# Patient Record
Sex: Female | Born: 1940 | Race: White | Hispanic: No | Smoking: Former smoker
Health system: Southern US, Community
[De-identification: ages and names within clinical notes are randomized; demographics above are authoritative.]

## PROBLEM LIST (undated history)

## (undated) DIAGNOSIS — C801 Malignant (primary) neoplasm, unspecified: Secondary | ICD-10-CM

## (undated) DIAGNOSIS — K219 Gastro-esophageal reflux disease without esophagitis: Secondary | ICD-10-CM

## (undated) DIAGNOSIS — Z972 Presence of dental prosthetic device (complete) (partial): Secondary | ICD-10-CM

## (undated) DIAGNOSIS — M419 Scoliosis, unspecified: Secondary | ICD-10-CM

## (undated) DIAGNOSIS — H269 Unspecified cataract: Secondary | ICD-10-CM

## (undated) DIAGNOSIS — I10 Essential (primary) hypertension: Secondary | ICD-10-CM

## (undated) DIAGNOSIS — T7840XA Allergy, unspecified, initial encounter: Secondary | ICD-10-CM

## (undated) DIAGNOSIS — I6529 Occlusion and stenosis of unspecified carotid artery: Secondary | ICD-10-CM

## (undated) DIAGNOSIS — M1990 Unspecified osteoarthritis, unspecified site: Secondary | ICD-10-CM

## (undated) DIAGNOSIS — E785 Hyperlipidemia, unspecified: Secondary | ICD-10-CM

## (undated) DIAGNOSIS — I341 Nonrheumatic mitral (valve) prolapse: Secondary | ICD-10-CM

## (undated) DIAGNOSIS — R011 Cardiac murmur, unspecified: Secondary | ICD-10-CM

## (undated) DIAGNOSIS — J449 Chronic obstructive pulmonary disease, unspecified: Secondary | ICD-10-CM

## (undated) HISTORY — PX: CARDIAC CATHETERIZATION: SHX172

## (undated) HISTORY — DX: Chronic obstructive pulmonary disease, unspecified: J44.9

## (undated) HISTORY — PX: CARPAL TUNNEL RELEASE: SHX101

## (undated) HISTORY — PX: ARTHROSCOPY KNEE W/ DRILLING: SUR92

## (undated) HISTORY — DX: Unspecified osteoarthritis, unspecified site: M19.90

## (undated) HISTORY — DX: Essential (primary) hypertension: I10

## (undated) HISTORY — DX: Unspecified cataract: H26.9

## (undated) HISTORY — PX: CATARACT EXTRACTION: SUR2

## (undated) HISTORY — PX: OTHER SURGICAL HISTORY: SHX169

## (undated) HISTORY — PX: BREAST SURGERY: SHX581

## (undated) HISTORY — DX: Allergy, unspecified, initial encounter: T78.40XA

## (undated) HISTORY — PX: CHOLECYSTECTOMY: SHX55

## (undated) HISTORY — PX: JOINT REPLACEMENT: SHX530

## (undated) HISTORY — DX: Hyperlipidemia, unspecified: E78.5

## (undated) HISTORY — PX: MOHS SURGERY: SUR867

## (undated) HISTORY — PX: EYE SURGERY: SHX253

## (undated) HISTORY — DX: Gastro-esophageal reflux disease without esophagitis: K21.9

## (undated) HISTORY — PX: ABDOMINAL HYSTERECTOMY: SHX81

## (undated) HISTORY — PX: HERNIA REPAIR: SHX51

## (undated) HISTORY — PX: BUNIONECTOMY: SHX129

## (undated) HISTORY — PX: REPLACEMENT TOTAL KNEE: SUR1224

## (undated) HISTORY — PX: APPENDECTOMY: SHX54

## (undated) HISTORY — PX: CARDIOVASCULAR STRESS TEST: SHX262

---

## 1980-12-19 HISTORY — PX: BREAST EXCISIONAL BIOPSY: SUR124

## 2007-12-20 HISTORY — PX: JOINT REPLACEMENT: SHX530

## 2013-12-19 HISTORY — PX: OTHER SURGICAL HISTORY: SHX169

## 2014-10-10 DIAGNOSIS — N393 Stress incontinence (female) (male): Secondary | ICD-10-CM | POA: Insufficient documentation

## 2014-10-10 DIAGNOSIS — F329 Major depressive disorder, single episode, unspecified: Secondary | ICD-10-CM | POA: Insufficient documentation

## 2014-10-10 DIAGNOSIS — J449 Chronic obstructive pulmonary disease, unspecified: Secondary | ICD-10-CM | POA: Insufficient documentation

## 2014-10-10 DIAGNOSIS — F32A Depression, unspecified: Secondary | ICD-10-CM | POA: Insufficient documentation

## 2014-10-15 ENCOUNTER — Ambulatory Visit: Payer: Self-pay | Admitting: Pain Medicine

## 2014-10-15 LAB — BASIC METABOLIC PANEL
ANION GAP: 4 — AB (ref 7–16)
BUN: 11 mg/dL (ref 7–18)
CHLORIDE: 109 mmol/L — AB (ref 98–107)
CREATININE: 0.8 mg/dL (ref 0.60–1.30)
Calcium, Total: 9.5 mg/dL (ref 8.5–10.1)
Co2: 30 mmol/L (ref 21–32)
EGFR (African American): >60
EGFR (Non-African Amer.): >60
Glucose: 104 mg/dL — ABNORMAL HIGH (ref 65–99)
Osmolality: 285 (ref 275–301)
POTASSIUM: 3.5 mmol/L (ref 3.5–5.1)
Sodium: 143 mmol/L (ref 136–145)

## 2014-10-15 LAB — HEPATIC FUNCTION PANEL A (ARMC)
ALT: 24 U/L
Albumin: 3.9 g/dL (ref 3.4–5.0)
Alkaline Phosphatase: 82 U/L
Bilirubin, Direct: 0.1 mg/dL (ref 0.0–0.2)
Bilirubin,Total: 0.6 mg/dL (ref 0.2–1.0)
SGOT(AST): 28 U/L (ref 15–37)
Total Protein: 7.9 g/dL (ref 6.4–8.2)

## 2014-10-15 LAB — SEDIMENTATION RATE: Erythrocyte Sed Rate: 16 mm/hr (ref 0–30)

## 2014-10-15 LAB — MAGNESIUM: Magnesium: 1.9 mg/dL

## 2014-10-23 ENCOUNTER — Ambulatory Visit: Payer: Self-pay | Admitting: Pain Medicine

## 2014-10-23 DIAGNOSIS — I2510 Atherosclerotic heart disease of native coronary artery without angina pectoris: Secondary | ICD-10-CM | POA: Insufficient documentation

## 2014-11-05 ENCOUNTER — Ambulatory Visit: Payer: Self-pay | Admitting: Pain Medicine

## 2014-11-10 DIAGNOSIS — I340 Nonrheumatic mitral (valve) insufficiency: Secondary | ICD-10-CM | POA: Insufficient documentation

## 2014-11-16 DIAGNOSIS — E782 Mixed hyperlipidemia: Secondary | ICD-10-CM | POA: Insufficient documentation

## 2014-12-30 ENCOUNTER — Ambulatory Visit: Payer: Self-pay | Admitting: Internal Medicine

## 2015-03-05 DIAGNOSIS — R002 Palpitations: Secondary | ICD-10-CM | POA: Insufficient documentation

## 2015-08-20 DIAGNOSIS — I10 Essential (primary) hypertension: Secondary | ICD-10-CM | POA: Insufficient documentation

## 2015-09-23 DIAGNOSIS — I351 Nonrheumatic aortic (valve) insufficiency: Secondary | ICD-10-CM | POA: Insufficient documentation

## 2015-10-12 ENCOUNTER — Ambulatory Visit: Payer: Medicare Other | Attending: Pain Medicine | Admitting: Pain Medicine

## 2015-10-12 ENCOUNTER — Encounter: Payer: Self-pay | Admitting: Pain Medicine

## 2015-10-12 VITALS — HR 63 | Temp 98.0°F | Resp 18 | Ht 62.0 in | Wt 152.0 lb

## 2015-10-12 DIAGNOSIS — F1120 Opioid dependence, uncomplicated: Secondary | ICD-10-CM

## 2015-10-12 DIAGNOSIS — G8929 Other chronic pain: Secondary | ICD-10-CM | POA: Insufficient documentation

## 2015-10-12 DIAGNOSIS — M810 Age-related osteoporosis without current pathological fracture: Secondary | ICD-10-CM | POA: Insufficient documentation

## 2015-10-12 DIAGNOSIS — M545 Low back pain, unspecified: Secondary | ICD-10-CM

## 2015-10-12 DIAGNOSIS — Z79891 Long term (current) use of opiate analgesic: Secondary | ICD-10-CM

## 2015-10-12 DIAGNOSIS — J449 Chronic obstructive pulmonary disease, unspecified: Secondary | ICD-10-CM | POA: Insufficient documentation

## 2015-10-12 DIAGNOSIS — M47896 Other spondylosis, lumbar region: Secondary | ICD-10-CM | POA: Insufficient documentation

## 2015-10-12 DIAGNOSIS — M47816 Spondylosis without myelopathy or radiculopathy, lumbar region: Secondary | ICD-10-CM

## 2015-10-12 DIAGNOSIS — M79605 Pain in left leg: Secondary | ICD-10-CM

## 2015-10-12 DIAGNOSIS — M4726 Other spondylosis with radiculopathy, lumbar region: Secondary | ICD-10-CM

## 2015-10-12 DIAGNOSIS — I10 Essential (primary) hypertension: Secondary | ICD-10-CM | POA: Diagnosis not present

## 2015-10-12 DIAGNOSIS — Z5181 Encounter for therapeutic drug level monitoring: Secondary | ICD-10-CM

## 2015-10-12 DIAGNOSIS — F1190 Opioid use, unspecified, uncomplicated: Secondary | ICD-10-CM | POA: Diagnosis not present

## 2015-10-12 DIAGNOSIS — Z79899 Other long term (current) drug therapy: Secondary | ICD-10-CM

## 2015-10-12 DIAGNOSIS — I2510 Atherosclerotic heart disease of native coronary artery without angina pectoris: Secondary | ICD-10-CM | POA: Diagnosis not present

## 2015-10-12 DIAGNOSIS — M549 Dorsalgia, unspecified: Secondary | ICD-10-CM | POA: Diagnosis present

## 2015-10-12 DIAGNOSIS — M5417 Radiculopathy, lumbosacral region: Secondary | ICD-10-CM

## 2015-10-12 MED ORDER — FENTANYL 25 MCG/HR TD PT72: 25.0000 ug | MEDICATED_PATCH | TRANSDERMAL | Status: DC

## 2015-10-12 MED ORDER — OXYCODONE-ACETAMINOPHEN 5-325 MG PO TABS: 1.0000 | ORAL_TABLET | Freq: Four times a day (QID) | ORAL | Status: DC | PRN

## 2015-10-12 NOTE — Progress Notes (Signed)
Patient's Name: Tina Frey MRN: [id number] DOB: 1941/06/25 DOS: 10/12/2015  Primary Reason(s) for Visit: Encounter for Medication Management. CC: Back Pain   HPI:   Tina Frey is a 74 y.o. year old, female patient, who returns today as an established patient. 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 Her primarily concern today is the Back Pain   The patient continues to have pain down the left lower extremity to the foot in what seems to be an S1 dermatomal distribution. In addition, she indicates that the worst of her pain is actually the lower back and going down to the buttocks. The patient was able to toe walk and heel walk today without any problems but she had significant discomfort on attempting to hyperextend backwards. I asked the patient about her last MRI and she cannot remember when she had it done. I asked her if her condition has continued to worsen and she indicated that it has. They also indicated that they have attempted some epidural steroid injections in the [place], [place]. Based on her condition in symptoms, believe that the patient probably has some left-sided foraminal stenosis, likely to be secondary to some facet hypertrophy. In addition, she does seem to have symptoms compatible with a lumbar facet syndrome on the left side. Today we will order an MRI  and have her come back after the test to abolish the results. Because her worst pain is the lower back, I have talked to her about the possibility of doing some diagnostic lumbar facet blocks, followed by some transforaminal epidural steroid injections.  Pharmacotherapy Review: Side-effects or Adverse reactions: None reported. Effectiveness: Described as relatively effective, allowing for increase in activities of daily living (ADL). Onset of action: Within expected pharmacological parameters. Duration of action: Within normal limits for medication. Peak effect: Timing and results are as within normal expected parameters. [place] PMP: Compliant with practice rules and regulations. DST: Compliant with practice rules and regulations. Lab work: No new labs ordered by our practice. Treatment compliance: Compliant. Substance Use Disorder (SUD) Risk Level: Low Planned course of action: Continue therapy as is.  Allergies: Tina Frey is allergic to cephalexin; ciprofloxacin; morphine and related; and sulfa antibiotics.  Meds: The patient has a current medication list which includes the following prescription(s): acetaminophen, albuterol, alendronate, aspirin, atorvastatin, biotin, fentanyl, fluticasone, lisinopril, loratadine, metoprolol succinate, oxycodone-acetaminophen, pantoprazole, sertraline, tiotropium, fentanyl, fentanyl, oxycodone-acetaminophen, and oxycodone-acetaminophen. Requested Prescriptions   Signed Prescriptions Disp Refills  . fentaNYL (DURAGESIC - DOSED MCG/HR) 25 MCG/HR patch 10 patch 0    Sig: Place 1 patch (25 mcg total) onto the skin every 3 (three) days.  Marland Kitchen oxyCODONE-acetaminophen (PERCOCET/ROXICET) 5-325 MG tablet 120 tablet 0    Sig: Take 1 tablet by mouth every 6 (six) hours as needed for severe pain.  . fentaNYL (DURAGESIC - DOSED MCG/HR) 25 MCG/HR patch 10 patch 0    Sig: Place 1 patch (25 mcg total) onto the skin every 3 (three) days.  Marland Kitchen oxyCODONE-acetaminophen  (PERCOCET) 5-325 MG tablet 120 tablet  0    Sig: Take 1 tablet by mouth every 6 (six) hours as needed for severe pain.  Marland Kitchen oxyCODONE-acetaminophen (PERCOCET) 5-325 MG tablet 120 tablet 0    Sig: Take 1 tablet by mouth every 6 (six) hours as needed for severe pain.  . fentaNYL (DURAGESIC - DOSED MCG/HR) 25 MCG/HR patch 10 patch 0    Sig: Place 1 patch (25 mcg total) onto the skin every 3 (three) days.    ROS: Constitutional: Afebrile, no chills, well hydrated and well nourished Gastrointestinal: negative Musculoskeletal:negative Neurological: negative Behavioral/Psych: negative  PFSH: Medical:  Tina Frey  has no past medical history on file. Family: family history includes Cancer in her mother; Heart disease in her brother and father. Surgical:  has past surgical history that includes Abdominal hysterectomy; Appendectomy; Hernia repair; Joint replacement; Breast surgery; Cholecystectomy; Hernia repair; Bunionectomy; cmc joint repair; Carpal tunnel release; Cataract extraction; Cardiac catheterization; biopsy lip; Arthroscopy knee w/ drilling; Replacement total knee; Carpal tunnel release; and right shoulder surgery. Tobacco:  has no tobacco history on file. Alcohol:  has no alcohol history on file. Drug:  has no drug history on file.  Physical Exam: Vitals:  Today's Vitals   10/12/15 0908 10/12/15 0912  Pulse: 63   Temp: 98 F (36.7 C)   TempSrc: Oral   Resp: 18   Height: 5\' 2"  (1.575 m)   Weight: 152 lb (68.947 kg)   PainSc:  9   Calculated BMI: Body mass index is 27.79 kg/(m^2). General appearance: alert, cooperative, appears stated age, moderate distress and mildly obese Eyes: conjunctivae/corneas clear. PERRL, EOM's intact. Fundi benign. Lungs: No evidence respiratory distress, no audible rales or ronchi and no use of accessory muscles of respiration Neck: no adenopathy, no carotid bruit, no JVD, supple, symmetrical, trachea midline and thyroid not enlarged, symmetric, no  tenderness/mass/nodules Back: symmetric, no curvature. ROM normal. No CVA tenderness. Extremities: extremities normal, atraumatic, no cyanosis or edema Pulses: 2+ and symmetric Skin: Skin color, texture, turgor normal. No rashes or lesions Neurologic: Gait: Antalgic    Assessment: Encounter Diagnosis:  Primary Diagnosis: Chronic pain [G89.29]  Plan: [place] was seen today for back pain.  Diagnoses and all orders for this visit:  Chronic pain -     fentaNYL (DURAGESIC - DOSED MCG/HR) 25 MCG/HR patch; Place 1 patch (25 mcg total) onto the skin every 3 (three) days. -     oxyCODONE-acetaminophen (PERCOCET/ROXICET) 5-325 MG tablet; Take 1 tablet by mouth every 6 (six) hours as needed for severe pain. -     fentaNYL (DURAGESIC - DOSED MCG/HR) 25 MCG/HR patch; Place 1 patch (25 mcg total) onto the skin every 3 (three) days. -     oxyCODONE-acetaminophen (PERCOCET) 5-325 MG tablet; Take 1 tablet by mouth every 6 (six) hours as needed for severe pain. -     oxyCODONE-acetaminophen (PERCOCET) 5-325 MG tablet; Take 1 tablet by mouth every 6 (six) hours as needed for severe pain. -     fentaNYL (DURAGESIC - DOSED MCG/HR) 25 MCG/HR patch; Place 1 patch (25 mcg total) onto the skin every 3 (three) days.  Chronic low back pain  Lumbar spondylosis  Lower extremity pain, left Comments: Left S1 dermatomal pain (lateral aspect of the foot)  Radicular pain of lumbosacral region Comments: Left S1 distribution Orders: -     MR Lumbar Spine Wo Contrast; Future  Facet syndrome, lumbar Comments: (L>R)  Opiate use  Uncomplicated opioid dependence ([place])  Long term prescription opiate use -  Drugs of abuse screen w/o alc, rtn urine-sln; Future  Long term current use of opiate analgesic  Encounter for therapeutic drug level monitoring     There are no Patient Instructions on file for this visit. Medications discontinued today:  Medications Discontinued During This Encounter  Medication  Reason  . fentaNYL (DURAGESIC - DOSED MCG/HR) 25 MCG/HR patch Reorder  . oxyCODONE-acetaminophen (PERCOCET/ROXICET) 5-325 MG tablet Reorder   Medications administered today:  Tina Frey had no medications administered during this visit.  Primary Care Physician: Glendon Axe, MD Location: [facility] Facility Note by: Kathlen Brunswick. Dossie Arbour, M.D, DABA, DABAPM, DABPM, DABIPP, FIPP

## 2015-10-12 NOTE — Assessment & Plan Note (Signed)
In another lower extremity pain is not as bad as the back pain, it does seem to have a dermatomal distribution following the S1. The plan is to order an MRI of the lumbar spine to determine if the patient has a surgically correctable lesion. The patient indicates that in the past she has had some epidural steroid injections with no benefit. We do not have a lot of information about this since they were [place] out of state. She does indicate that her condition has been worsening since the last time that she had an MRI done.

## 2015-10-12 NOTE — Assessment & Plan Note (Signed)
Between the patient's low back pain and leg pain, she indicates the low back to be worse. This low back pain is described to be on the left side. The plan is to do a diagnostic left sided lumbar facet block and determine if she would be a good candidate for RFA.

## 2015-10-12 NOTE — Progress Notes (Signed)
Safety precautions to be maintained throughout the outpatient stay will include: orient to surroundings, keep bed in low position, maintain call bell within reach at all times, provide assistance with transfer out of bed and ambulation.  

## 2015-10-16 DIAGNOSIS — Z96659 Presence of unspecified artificial knee joint: Secondary | ICD-10-CM | POA: Insufficient documentation

## 2015-10-29 ENCOUNTER — Ambulatory Visit
Admission: RE | Admit: 2015-10-29 | Discharge: 2015-10-29 | Disposition: A | Discharge status: Home / Self Care | Payer: Medicare Other | Source: Ambulatory Visit | Attending: Pain Medicine | Admitting: Pain Medicine

## 2015-10-29 DIAGNOSIS — M419 Scoliosis, unspecified: Secondary | ICD-10-CM | POA: Diagnosis not present

## 2015-10-29 DIAGNOSIS — M47896 Other spondylosis, lumbar region: Secondary | ICD-10-CM | POA: Insufficient documentation

## 2015-10-29 DIAGNOSIS — M5417 Radiculopathy, lumbosacral region: Secondary | ICD-10-CM

## 2015-10-29 DIAGNOSIS — E279 Disorder of adrenal gland, unspecified: Secondary | ICD-10-CM | POA: Insufficient documentation

## 2015-11-02 ENCOUNTER — Encounter: Payer: Self-pay | Admitting: Pain Medicine

## 2015-11-05 ENCOUNTER — Other Ambulatory Visit: Payer: Self-pay | Admitting: Pain Medicine

## 2015-11-30 ENCOUNTER — Encounter: Payer: Self-pay | Admitting: Pain Medicine

## 2015-11-30 ENCOUNTER — Ambulatory Visit: Payer: Medicare Other | Attending: Pain Medicine | Admitting: Pain Medicine

## 2015-11-30 ENCOUNTER — Telehealth: Payer: Self-pay

## 2015-11-30 VITALS — BP 155/61 | HR 62 | Temp 98.4°F | Resp 18 | Ht 62.0 in | Wt 148.0 lb

## 2015-11-30 DIAGNOSIS — M549 Dorsalgia, unspecified: Secondary | ICD-10-CM | POA: Diagnosis present

## 2015-11-30 DIAGNOSIS — I10 Essential (primary) hypertension: Secondary | ICD-10-CM | POA: Insufficient documentation

## 2015-11-30 DIAGNOSIS — E7801 Familial hypercholesterolemia: Secondary | ICD-10-CM | POA: Insufficient documentation

## 2015-11-30 DIAGNOSIS — M5124 Other intervertebral disc displacement, thoracic region: Secondary | ICD-10-CM

## 2015-11-30 DIAGNOSIS — G8929 Other chronic pain: Secondary | ICD-10-CM | POA: Diagnosis not present

## 2015-11-30 DIAGNOSIS — Z5181 Encounter for therapeutic drug level monitoring: Secondary | ICD-10-CM

## 2015-11-30 DIAGNOSIS — Z79891 Long term (current) use of opiate analgesic: Secondary | ICD-10-CM

## 2015-11-30 DIAGNOSIS — J449 Chronic obstructive pulmonary disease, unspecified: Secondary | ICD-10-CM | POA: Diagnosis not present

## 2015-11-30 DIAGNOSIS — M5126 Other intervertebral disc displacement, lumbar region: Secondary | ICD-10-CM | POA: Diagnosis not present

## 2015-11-30 DIAGNOSIS — M4726 Other spondylosis with radiculopathy, lumbar region: Secondary | ICD-10-CM

## 2015-11-30 DIAGNOSIS — Z79899 Other long term (current) drug therapy: Secondary | ICD-10-CM

## 2015-11-30 DIAGNOSIS — F1190 Opioid use, unspecified, uncomplicated: Secondary | ICD-10-CM

## 2015-11-30 DIAGNOSIS — M51369 Other intervertebral disc degeneration, lumbar region without mention of lumbar back pain or lower extremity pain: Secondary | ICD-10-CM

## 2015-11-30 DIAGNOSIS — M47816 Spondylosis without myelopathy or radiculopathy, lumbar region: Secondary | ICD-10-CM

## 2015-11-30 DIAGNOSIS — M4180 Other forms of scoliosis, site unspecified: Secondary | ICD-10-CM

## 2015-11-30 DIAGNOSIS — M47896 Other spondylosis, lumbar region: Secondary | ICD-10-CM

## 2015-11-30 DIAGNOSIS — I2510 Atherosclerotic heart disease of native coronary artery without angina pectoris: Secondary | ICD-10-CM | POA: Diagnosis not present

## 2015-11-30 DIAGNOSIS — M79605 Pain in left leg: Secondary | ICD-10-CM

## 2015-11-30 DIAGNOSIS — E559 Vitamin D deficiency, unspecified: Secondary | ICD-10-CM

## 2015-11-30 DIAGNOSIS — M5417 Radiculopathy, lumbosacral region: Secondary | ICD-10-CM | POA: Diagnosis not present

## 2015-11-30 DIAGNOSIS — M545 Low back pain: Secondary | ICD-10-CM

## 2015-11-30 DIAGNOSIS — I340 Nonrheumatic mitral (valve) insufficiency: Secondary | ICD-10-CM | POA: Diagnosis not present

## 2015-11-30 DIAGNOSIS — M5136 Other intervertebral disc degeneration, lumbar region: Secondary | ICD-10-CM | POA: Insufficient documentation

## 2015-11-30 NOTE — Telephone Encounter (Signed)
Called and discussed with MRI results- pt will come 12/01/15 to get prescriptions for 2 months- will need to cancell appt in January and reschedule for March- will give pt copy of MRi when she arrived to pick up Rx

## 2015-11-30 NOTE — Progress Notes (Signed)
Patient ID: Tina Frey, female   DOB: 09/05/1941, 74 y.o.   MRN: [id number] The patient came into the clinic today for her regular appointment, but unfortunately had to leave early because her husband had another medical appointment on the same date. We will not be billing this patient for this visit. However, I have taken the time to review her chart and updated. I have reviewed the results of her MRI and I have called the patient back and of her her as a return appointment to go over the MRI, provided with a copy of that, and also take care of her prescriptions. She indicated that she would be coming in tomorrow at some time to take care of that.

## 2015-11-30 NOTE — Progress Notes (Signed)
Safety precautions to be maintained throughout the outpatient stay will include: orient to surroundings, keep bed in low position, maintain call bell within reach at all times, provide assistance with transfer out of bed and ambulation.  Pt is here today for eval of MRI that was taken on 10/1015 and on EPIC to review Pt left- husband has a dr appt at 1030 and can not wait  Any longer- states she has appt in Jan and will find out MRI results then

## 2015-12-01 MED ORDER — FENTANYL 25 MCG/HR TD PT72: 25.0000 ug | MEDICATED_PATCH | TRANSDERMAL | Status: DC

## 2015-12-01 MED ORDER — OXYCODONE-ACETAMINOPHEN 5-325 MG PO TABS: 1.0000 | ORAL_TABLET | Freq: Four times a day (QID) | ORAL | Status: DC | PRN

## 2015-12-01 NOTE — Progress Notes (Signed)
Patient's Name: Tina Frey MRN: [id number] DOB: 10-23-1941 DOS: 11/30/2015  Primary Reason(s) for Visit: Encounter for Medication Management CC: Back Pain   HPI:   Tina Frey is a 74 y.o. year old, female patient, who returns today as an established patient. She has Encounter for therapeutic drug level monitoring; Long term current use of opiate analgesic; Long term prescription opiate use; Uncomplicated opioid dependence ([place]); Opiate use; Lumbar facet syndrome (L>R); Lumbosacral radiculopathy (Left) (S1 Dermatome); Lower extremity pain (Left) (Location of Primary Pain) (S1 Dermatome); Lumbar spondylosis; Chronic low back pain; Chronic pain; 3-vessel CAD; Benign essential HTN; Chronic obstructive pulmonary disease ([place]); Clinical depression; Combined fat and carbohydrate induced hyperlipemia; AI (aortic incompetence); MI (mitral incompetence); OP (osteoporosis); Awareness of heartbeats; 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 Her primarily concern today is the Back Pain     Today the patient came in for a nurse visit.  Today's Pain Score: 9 , clinically she looks like a 2-3/10. Reported level of pain is incompatible with clinical obrservations. This may be secondary to a possible lack of understanding on how the pain scale works. Pain Type: Chronic pain Pain Location: Back Pain Orientation: Lower Pain Descriptors / Indicators: Aching, Constant, Radiating Pain Frequency: Constant  Date of Last Visit: 10/12/15 Service Provided on Last Visit: Med Refill (scheduled MRI)  Pharmacotherapy Review:   Side-effects or Adverse reactions: None reported Effectiveness:  Described as relatively effective, allowing for increase in activities of daily living (ADL) Onset of action: Within expected pharmacological parameters Duration of action: Within normal limits for medication Peak effect: Timing and results are as within normal expected parameters [place] PMP: Compliant with practice rules and regulations UDS Results:   none available at this time UDS Interpretation: No UDS available, at this time Medication Assessment Form: Reviewed. Patient indicates being compliant with therapy Treatment compliance: Compliant Substance Use Disorder (SUD) Risk Level: Low Pharmacologic Plan: Continue therapy as is  Lab Work: Inflammation Markers Lab Results  Component Value Date   ESRSEDRATE 16 10/15/2014    Renal Function Lab Results  Component Value Date   BUN 11 10/15/2014   CREATININE 0.80 10/15/2014   GFRAA >60 10/15/2014   GFRNONAA >60 10/15/2014    Hepatic Function Lab Results  Component Value Date   AST 28 10/15/2014   ALT 24 10/15/2014   ALBUMIN 3.9 10/15/2014    Electrolytes Lab Results  Component Value Date   NA 143 10/15/2014   K 3.5 10/15/2014   CL 109* 10/15/2014   CALCIUM 9.5 123XX123    Illicit Drugs No results found for: THCU, COCAINSCRNUR, PCPSCRNUR, MDMA, AMPHETMU, METHADONE, ETOH   Allergies: Tina Frey is allergic to cephalexin; ciprofloxacin; morphine and related; and sulfa antibiotics.  Meds: The patient has a current medication list which includes the following prescription(s): albuterol, alendronate, aspirin, atorvastatin, biotin, fentanyl, fentanyl, fentanyl, fluticasone, lisinopril, loratadine, metoprolol succinate, oxycodone-acetaminophen, oxycodone-acetaminophen, pantoprazole, sertraline, tiotropium, and oxycodone-acetaminophen. Requested Prescriptions   Signed Prescriptions Disp Refills  . fentaNYL (DURAGESIC - DOSED MCG/HR) 25 MCG/HR patch 10 patch 0    Sig: Place 1 patch (25 mcg total) onto the skin every 3  (three) days.  . fentaNYL (DURAGESIC - DOSED MCG/HR) 25 MCG/HR patch 10 patch 0    Sig: Place  1 patch (25 mcg total) onto the skin every 3 (three) days.  Marland Kitchen oxyCODONE-acetaminophen (PERCOCET/ROXICET) 5-325 MG tablet 120 tablet 0    Sig: Take 1 tablet by mouth every 6 (six) hours as needed for moderate pain or severe pain.  Marland Kitchen oxyCODONE-acetaminophen (PERCOCET) 5-325 MG tablet 120 tablet 0    Sig: Take 1 tablet by mouth every 6 (six) hours as needed for moderate pain or severe pain.    ROS: Constitutional: Afebrile, no chills, well hydrated and well nourished Gastrointestinal: negative Musculoskeletal:negative Neurological: negative Behavioral/Psych: negative  PFSH: Medical:  Tina Frey  has a past medical history of GERD (gastroesophageal reflux disease); Cataract; Allergy; Arthritis; COPD (chronic obstructive pulmonary disease) ([place]); Hypertension; and Hyperlipidemia. Family: family history includes Cancer in her mother; Heart disease in her brother and father. Surgical:  has past surgical history that includes Abdominal hysterectomy; Appendectomy; Hernia repair; Joint replacement; Breast surgery; Cholecystectomy; Hernia repair; Bunionectomy; cmc joint repair; Carpal tunnel release; Cataract extraction; Cardiac catheterization; biopsy lip; Arthroscopy knee w/ drilling; Replacement total knee; Carpal tunnel release; and right shoulder surgery. Tobacco:  reports that she has never smoked. She does not have any smokeless tobacco history on file. Alcohol:  reports that she does not drink alcohol. Drug:  reports that she does not use illicit drugs.  Physical Exam: Vitals:  Today's Vitals   11/30/15 0902 11/30/15 0904  BP: 155/61   Pulse: 62   Temp: 98.4 F (36.9 C)   TempSrc: Oral   Resp: 18   Height: 5\' 2"  (1.575 m)   Weight: 148 lb (67.132 kg)   SpO2: 98%   PainSc:  9   Calculated BMI: Body mass index is 27.06 kg/(m^2). General appearance: alert, cooperative, appears stated age  and no distress Eyes: PERLA Respiratory: No evidence respiratory distress, no audible rales or ronchi and no use of accessory muscles of respiration Neck: no adenopathy, no carotid bruit, no JVD, supple, symmetrical, trachea midline and thyroid not enlarged, symmetric, no tenderness/mass/nodules  Cervical Spine ROM: Adequate for flexion, extension, rotation, and lateral bending Palpation: No palpable trigger points  Upper Extremities ROM: Adequate bilaterally Strength: 5/5 for all flexors and extensors of the upper extremity, bilaterally Pulses: Palpable bilaterally Neurologic: No allodynia, No hyperesthesia, No hyperpathia and No sensory abnormalities  Lumbar Spine ROM: Adequate for flexion, extension, rotation, and lateral bending Palpation: No palpable trigger points Lumbar Hyperextension and rotation: Non-contributory Patrick's Maneuver: Non-contributory  Lower Extremities ROM: Adequate bilaterally Strength: 5/5 for all flexors and extensors of the lower extremity, bilaterally Pulses: Palpable bilaterally Neurologic: No allodynia, No hyperesthesia, No hyperpathia, No sensory abnormalities and No antalgic gait or posture  Assessment: Encounter Diagnosis:  Primary Diagnosis: Chronic pain [G89.29]  Plan: Interventional Therapies: None at this point  [place] was seen today for back pain.  Diagnoses and all orders for this visit:  Chronic pain -     COMPLETE METABOLIC PANEL WITH GFR; Future -     C-reactive protein; Future -     Magnesium; Future -     Sedimentation rate; Future -     fentaNYL (DURAGESIC - DOSED MCG/HR) 25 MCG/HR patch; Place 1 patch (25 mcg total) onto the skin every 3 (three) days. -     fentaNYL (DURAGESIC - DOSED MCG/HR) 25 MCG/HR patch; Place 1 patch (25 mcg total) onto the skin every 3 (three) days. -     oxyCODONE-acetaminophen (PERCOCET/ROXICET) 5-325 MG tablet; Take 1 tablet by mouth every 6 (six) hours as needed for moderate pain or severe  pain. -      oxyCODONE-acetaminophen (PERCOCET) 5-325 MG tablet; Take 1 tablet by mouth every 6 (six) hours as needed for moderate pain or severe pain.  Opiate use  Long term prescription opiate use  Long term current use of opiate analgesic -     Drugs of abuse screen w/o alc, rtn urine-sln  Encounter for therapeutic drug level monitoring  Vitamin D deficiency -     Vitamin D pnl(25-hydrxy+1,25-dihy)-bld; Future  Lumbosacral radiculopathy (Left) (S1 Dermatome)  Levoscoliosis  Lower extremity pain (Left) (Location of Primary Pain) (S1 Dermatome)  Lumbar facet syndrome (L>R)  Lumbar spondylosis  Lumbar facet hypertrophy (L2-3, L3-4, L4-L5, L5-S1)  Thoracic disc herniation (T12-L1 disc protrusion)  Lumbar disc bulging (L2-3, L3-4, and L4-5)  Lumbar disc herniation  (Right) (L1-2: Small right-sided disc protrusion with upgoing disc material)     There are no Patient Instructions on file for this visit. Medications discontinued today:  Medications Discontinued During This Encounter  Medication Reason  . acetaminophen (TYLENOL) 500 MG tablet Error  . oxyCODONE-acetaminophen (PERCOCET/ROXICET) 123XX123 MG tablet Duplicate  . fentaNYL (DURAGESIC - DOSED MCG/HR) 25 MCG/HR patch Reorder  . fentaNYL (DURAGESIC - DOSED MCG/HR) 25 MCG/HR patch Reorder  . oxyCODONE-acetaminophen (PERCOCET) 5-325 MG tablet Reorder   Medications administered today:  Ms. Otterman had no medications administered during this visit.  Primary Care Physician: Glendon Axe, MD Location: [facility] Out[facility] Facility Note by: Kathlen Brunswick. Dossie Arbour, M.D, DABA, DABAPM, DABPM, DABIPP, FIPP

## 2016-01-11 ENCOUNTER — Encounter: Payer: Medicare Other | Admitting: Pain Medicine

## 2016-02-24 ENCOUNTER — Ambulatory Visit: Payer: Medicare Other | Attending: Pain Medicine | Admitting: Pain Medicine

## 2016-02-24 ENCOUNTER — Encounter: Payer: Self-pay | Admitting: Pain Medicine

## 2016-02-24 ENCOUNTER — Other Ambulatory Visit
Admission: RE | Admit: 2016-02-24 | Discharge: 2016-02-24 | Disposition: A | Discharge status: Home / Self Care | Payer: Medicare Other | Source: Ambulatory Visit | Attending: Pain Medicine | Admitting: Pain Medicine

## 2016-02-24 VITALS — BP 162/76 | HR 63 | Temp 97.7°F | Resp 16 | Ht 62.0 in | Wt 150.0 lb

## 2016-02-24 DIAGNOSIS — M810 Age-related osteoporosis without current pathological fracture: Secondary | ICD-10-CM | POA: Insufficient documentation

## 2016-02-24 DIAGNOSIS — G8929 Other chronic pain: Secondary | ICD-10-CM | POA: Insufficient documentation

## 2016-02-24 DIAGNOSIS — M899 Disorder of bone, unspecified: Secondary | ICD-10-CM | POA: Diagnosis not present

## 2016-02-24 DIAGNOSIS — E559 Vitamin D deficiency, unspecified: Secondary | ICD-10-CM | POA: Insufficient documentation

## 2016-02-24 DIAGNOSIS — F329 Major depressive disorder, single episode, unspecified: Secondary | ICD-10-CM | POA: Diagnosis not present

## 2016-02-24 DIAGNOSIS — F1120 Opioid dependence, uncomplicated: Secondary | ICD-10-CM | POA: Diagnosis not present

## 2016-02-24 DIAGNOSIS — M549 Dorsalgia, unspecified: Secondary | ICD-10-CM | POA: Diagnosis present

## 2016-02-24 DIAGNOSIS — M5126 Other intervertebral disc displacement, lumbar region: Secondary | ICD-10-CM | POA: Insufficient documentation

## 2016-02-24 DIAGNOSIS — K219 Gastro-esophageal reflux disease without esophagitis: Secondary | ICD-10-CM | POA: Diagnosis not present

## 2016-02-24 DIAGNOSIS — N393 Stress incontinence (female) (male): Secondary | ICD-10-CM | POA: Insufficient documentation

## 2016-02-24 DIAGNOSIS — Z79891 Long term (current) use of opiate analgesic: Secondary | ICD-10-CM | POA: Diagnosis not present

## 2016-02-24 DIAGNOSIS — J449 Chronic obstructive pulmonary disease, unspecified: Secondary | ICD-10-CM | POA: Diagnosis not present

## 2016-02-24 DIAGNOSIS — E7801 Familial hypercholesterolemia: Secondary | ICD-10-CM | POA: Diagnosis not present

## 2016-02-24 DIAGNOSIS — Z5181 Encounter for therapeutic drug level monitoring: Secondary | ICD-10-CM

## 2016-02-24 DIAGNOSIS — I340 Nonrheumatic mitral (valve) insufficiency: Secondary | ICD-10-CM | POA: Diagnosis not present

## 2016-02-24 DIAGNOSIS — I10 Essential (primary) hypertension: Secondary | ICD-10-CM | POA: Insufficient documentation

## 2016-02-24 DIAGNOSIS — Z96659 Presence of unspecified artificial knee joint: Secondary | ICD-10-CM | POA: Insufficient documentation

## 2016-02-24 DIAGNOSIS — M79605 Pain in left leg: Secondary | ICD-10-CM | POA: Diagnosis not present

## 2016-02-24 DIAGNOSIS — M5124 Other intervertebral disc displacement, thoracic region: Secondary | ICD-10-CM | POA: Diagnosis not present

## 2016-02-24 DIAGNOSIS — M79606 Pain in leg, unspecified: Secondary | ICD-10-CM | POA: Diagnosis present

## 2016-02-24 DIAGNOSIS — M47896 Other spondylosis, lumbar region: Secondary | ICD-10-CM | POA: Diagnosis not present

## 2016-02-24 DIAGNOSIS — Z9049 Acquired absence of other specified parts of digestive tract: Secondary | ICD-10-CM | POA: Diagnosis not present

## 2016-02-24 LAB — COMPREHENSIVE METABOLIC PANEL
ALBUMIN: 4.3 g/dL (ref 3.5–5.0)
ALK PHOS: 65 U/L (ref 38–126)
ALT: 17 U/L (ref 14–54)
ANION GAP: 6 (ref 5–15)
AST: 23 U/L (ref 15–41)
BILIRUBIN TOTAL: 0.8 mg/dL (ref 0.3–1.2)
BUN: 17 mg/dL (ref 6–20)
CALCIUM: 9.6 mg/dL (ref 8.9–10.3)
CO2: 28 mmol/L (ref 22–32)
CREATININE: 0.63 mg/dL (ref 0.44–1.00)
Chloride: 105 mmol/L (ref 101–111)
GFR calc non Af Amer: >60 mL/min (ref >60)
GLUCOSE: 96 mg/dL (ref 65–99)
Potassium: 4.5 mmol/L (ref 3.5–5.1)
SODIUM: 139 mmol/L (ref 135–145)
TOTAL PROTEIN: 7.4 g/dL (ref 6.5–8.1)

## 2016-02-24 LAB — MAGNESIUM: MAGNESIUM: 2 mg/dL (ref 1.7–2.4)

## 2016-02-24 LAB — SEDIMENTATION RATE: SED RATE: 12 mm/h (ref 0–30)

## 2016-02-24 LAB — C-REACTIVE PROTEIN

## 2016-02-24 MED ORDER — FENTANYL 25 MCG/HR TD PT72: 25.0000 ug | MEDICATED_PATCH | TRANSDERMAL | Status: DC

## 2016-02-24 MED ORDER — OXYCODONE HCL 10 MG PO TABS: 5.0000 mg | ORAL_TABLET | Freq: Every day | ORAL | Status: DC | PRN

## 2016-02-24 MED ORDER — OXYCODONE-ACETAMINOPHEN 5-325 MG PO TABS: 1.0000 | ORAL_TABLET | Freq: Four times a day (QID) | ORAL | Status: DC | PRN

## 2016-02-24 NOTE — Progress Notes (Signed)
Patient's Name: Tina Frey MRN: [id number] DOB: [birth year] DOS: 02/24/2016  Primary Reason(s) for Visit: Encounter for Medication Management CC: Back Pain and Leg Pain   HPI  Tina Frey is a 75 y.o. year old, female patient, who returns today as an established patient. 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 Her primarily concern today is the Back Pain and Leg Pain   The patient returns to the clinic today for pharmacological management of her chronic pain.  Pain Assessment: Self-Reported Pain Score: 8 , clinically she looks like a 1-2/10. Reported level is inconsistent with clinical obrservations Pain Type: Chronic pain Pain Location: Back Pain Orientation: Lower Pain Descriptors / Indicators: Aching, Burning, Tingling, Throbbing, Numbness Pain Frequency: Constant  Date of Last Visit: 11/30/15 Service Provided on Last Visit: Evaluation, Med Refill  Controlled Substance Pharmacotherapy  Assessment  Analgesic: Duragesic 25 mcg/h every 72 hours plus oxycodone/APAP 5/325 one tablet by mouth every 6 hours (The patient indicates using only an average of 1 per day. Occasionally she will use up to 3 per day, but she uses an average of 30 per months.) MME/day: 90 mg/day Pharmacokinetics: Onset of action (Liberation/Absorption): Within expected pharmacological parameters Time to Peak effect (Distribution): Timing and results are as within normal expected parameters Duration of action (Metabolism/Excretion): Within normal limits for medication Pharmacodynamics: Analgesic Effect: More than 50% Activity Facilitation: Medication(s) allow patient to sit, stand, walk, and do the basic ADLs Perceived Effectiveness: Described as relatively effective, allowing for increase in activities of daily living (ADL) Side-effects or Adverse reactions: None reported Monitoring: [place] PMP: Compliant with practice rules and regulations UDS Results/interpretation: The patient's last UDS was done on 10/12/2015 and it came back within normal limits and with no unexpected results. Medication Assessment Form: Reviewed. Patient indicates being compliant with therapy Treatment compliance: Compliant Risk Assessment: Aberrant Behavior: None observed today Substance Use Disorder (SUD) Risk Level: Low Opioid Risk Tool (ORT) Score: Total Score: 0 Low Risk for SUD (Score <3) Depression Scale Score: PHQ-2: PHQ-2 Total Score: 0 No depression (0) PHQ-9: PHQ-9 Total Score: 0 No depression (0-4)  Pharmacologic Plan: No change in therapy, at this time   Laboratory Workup  Last ED UDS: No results found for: THCU, COCAINSCRNUR, PCPSCRNUR, MDMA, AMPHETMU, METHADONE, ETOH  Inflammation Markers Lab Results  Component Value Date   ESRSEDRATE 16 10/15/2014    Renal Function Lab Results  Component Value Date   BUN 17 02/24/2016   CREATININE 0.63 02/24/2016   GFRAA >60 02/24/2016   GFRNONAA >60 02/24/2016     Hepatic Function Lab Results  Component Value Date   AST 23 02/24/2016   ALT 17 02/24/2016   ALBUMIN 4.3 02/24/2016    Electrolytes Lab Results  Component Value Date  NA 139 02/24/2016   K 4.5 02/24/2016   CL 105 02/24/2016   CALCIUM 9.6 02/24/2016   MG 2.0 02/24/2016    Allergies  Tina Frey is allergic to cephalexin; ciprofloxacin; morphine and related; and sulfa antibiotics.  Meds  The patient has a current medication list which includes the following prescription(s): albuterol, alendronate, aspirin, atorvastatin, biotin, calcium-vitamin d, fentanyl, fentanyl, fentanyl, fluticasone, lisinopril, loratadine, metoprolol succinate, multiple vitamins-minerals, pantoprazole, sertraline, tiotropium, oxycodone hcl, oxycodone hcl, and oxycodone hcl.  Current Outpatient Prescriptions on File Prior to Visit  Medication Sig  . ALBUTEROL IN Inhale into the lungs.  Marland Kitchen alendronate (FOSAMAX) 70 MG tablet Take 70 mg by mouth once a week. Take with a full glass of water on an empty stomach.  Marland Kitchen aspirin 81 MG chewable tablet Chew 81 mg by mouth daily.  Marland Kitchen atorvastatin (LIPITOR) 20 MG tablet Take 20 mg by mouth daily.  . Biotin 5000 MCG CAPS Take 5,000 capsules by mouth daily.  . fluticasone (FLONASE) 50 MCG/ACT nasal spray Place 2 sprays into both nostrils daily.  Marland Kitchen lisinopril (PRINIVIL,ZESTRIL) 10 MG tablet Take 10 mg by mouth daily.  Marland Kitchen loratadine (CLARITIN) 10 MG tablet Take 10 mg by mouth daily.  . metoprolol succinate (TOPROL-XL) 25 MG 24 hr tablet Take 25 mg by mouth 2 (two) times daily.  . pantoprazole (PROTONIX) 40 MG tablet Take 40 mg by mouth daily.  . sertraline (ZOLOFT) 50 MG tablet Take 50 mg by mouth daily.  Marland Kitchen tiotropium (SPIRIVA) 18 MCG inhalation capsule Place 18 mcg into inhaler and inhale daily.   No current facility-administered medications on file prior to visit.    ROS  Constitutional: Afebrile, no chills, well hydrated and well nourished Gastrointestinal:  negative Musculoskeletal:negative Neurological: negative Behavioral/Psych: negative  PFSH  Medical:  Tina Frey  has a past medical history of GERD (gastroesophageal reflux disease); Cataract; Allergy; Arthritis; COPD (chronic obstructive pulmonary disease) ([place]); Hypertension; and Hyperlipidemia. Family: family history includes Cancer in her mother; Heart disease in her brother and father. Surgical:  has past surgical history that includes Abdominal hysterectomy; Appendectomy; Hernia repair; Joint replacement; Breast surgery; Cholecystectomy; Hernia repair; Bunionectomy; cmc joint repair; Carpal tunnel release; Cataract extraction; Cardiac catheterization; biopsy lip; Arthroscopy knee w/ drilling; Replacement total knee; Carpal tunnel release; and right shoulder surgery. Tobacco:  reports that she has never smoked. She does not have any smokeless tobacco history on file. Alcohol:  reports that she does not drink alcohol. Drug:  reports that she does not use illicit drugs.  Physical Exam  Vitals:  Today's Vitals   02/24/16 0944  BP: 162/76  Pulse: 63  Temp: 97.7 F (36.5 C)  TempSrc: Oral  Resp: 16  Height: 5\' 2"  (1.575 m)  Weight: 150 lb (68.04 kg)  SpO2: 98%  PainSc: 8   PainLoc: Back    Calculated BMI: Body mass index is 27.43 kg/(m^2). Overweight (25-29.9 kg/m2) - 20% higher incidence of chronic pain  General appearance: alert, cooperative, appears stated age and no distress Eyes: PERLA Respiratory: No evidence respiratory distress, no audible rales or ronchi and no use of accessory muscles of respiration  Cervical Spine Inspection: Normal anatomy Alignment: Symetrical ROM: Adequate  Upper Extremities Inspection: No gross anomalies detected ROM: Adequate Sensory: Normal Motor: Unremarkable  Thoracic Spine Inspection: No gross anomalies detected Alignment: Symetrical ROM: Adequate  Lumbar Spine Inspection: No gross anomalies detected Alignment:  Symetrical ROM: Decreased  Gait: Antalgic (limping)  Lower Extremities Inspection: No gross anomalies detected ROM: Adequate Sensory:  Normal Motor: Unremarkable  Assessment & Plan  Primary Diagnosis & Pertinent Problem List: The primary encounter diagnosis was Chronic pain. Diagnoses of Encounter for therapeutic drug level monitoring, Long term current use of opiate analgesic, Lower extremity pain (Left) (Location of Primary Pain) (S1 Dermatome), Osteoporosis, post-menopausal, Vitamin D deficiency, and Chronic lower extremity pain (Location of Primary Source of Pain) (S1 Dermatome) (Left) were also pertinent to this visit.  Visit Diagnosis: 1. Chronic pain   2. Encounter for therapeutic drug level monitoring   3. Long term current use of opiate analgesic   4. Lower extremity pain (Left) (Location of Primary Pain) (S1 Dermatome)   5. Osteoporosis, post-menopausal   6. Vitamin D deficiency   7. Chronic lower extremity pain (Location of Primary Source of Pain) (S1 Dermatome) (Left)     Problem-specific Plan(s): No problem-specific assessment & plan notes found for this encounter.   Plan of Care  Pharmacotherapy (Medications Ordered): Meds ordered this encounter  Medications  . fentaNYL (DURAGESIC - DOSED MCG/HR) 25 MCG/HR patch    Sig: Place 1 patch (25 mcg total) onto the skin every 3 (three) days.    Dispense:  10 patch    Refill:  0    Do not place this medication, or any other prescription from our practice, on "Automatic Refill". Patient may have prescription filled one day early if pharmacy is closed on scheduled refill date. Do not fill until: 03/07/16 To last until: 04/06/16  . fentaNYL (DURAGESIC - DOSED MCG/HR) 25 MCG/HR patch    Sig: Place 1 patch (25 mcg total) onto the skin every 3 (three) days.    Dispense:  10 patch    Refill:  0    Do not place this medication, or any other prescription from our practice, on "Automatic Refill". Patient may have prescription  filled one day early if pharmacy is closed on scheduled refill date. Do not fill until: 04/06/16 To last until: 05/06/16  . DISCONTD: fentaNYL (DURAGESIC - DOSED MCG/HR) 25 MCG/HR patch    Sig: Place 1 patch (25 mcg total) onto the skin every 3 (three) days.    Dispense:  10 patch    Refill:  0    Do not place this medication, or any other prescription from our practice, on "Automatic Refill". Patient may have prescription filled one day early if pharmacy is closed on scheduled refill date. Do not fill until: 05/06/16 To last until: 06/05/16  . DISCONTD: oxyCODONE-acetaminophen (PERCOCET) 5-325 MG tablet    Sig: Take 1 tablet by mouth every 6 (six) hours as needed for severe pain.    Dispense:  120 tablet    Refill:  0    Do not place this medication, or any other prescription from our practice, on "Automatic Refill". Patient may have prescription filled one day early if pharmacy is closed on scheduled refill date. Do not fill until: 03/07/16 To last until: 04/06/16  . DISCONTD: oxyCODONE-acetaminophen (PERCOCET) 5-325 MG tablet    Sig: Take 1 tablet by mouth every 6 (six) hours as needed for moderate pain or severe pain.    Dispense:  120 tablet    Refill:  0    Do not place this medication, or any other prescription from our practice, on "Automatic Refill". Patient may have prescription filled one day early if pharmacy is closed on scheduled refill date. Do not fill until: 04/06/16 To last until: 05/06/16  . DISCONTD: oxyCODONE-acetaminophen (PERCOCET/ROXICET) 5-325 MG tablet    Sig: Take 1 tablet  by mouth every 6 (six) hours as needed for moderate pain or severe pain.    Dispense:  120 tablet    Refill:  0    Do not place this medication, or any other prescription from our practice, on "Automatic Refill". Patient may have prescription filled one day early if pharmacy is closed on scheduled refill date. Do not fill until: 05/06/16 To last until: 06/05/16  . Oxycodone HCl 10 MG TABS     Sig: Take 0.5-1 tablets (5-10 mg total) by mouth daily as needed.    Dispense:  30 tablet    Refill:  0    Do not place this medication, or any other prescription from our practice, on "Automatic Refill". Patient may have prescription filled one day early if pharmacy is closed on scheduled refill date. Do not fill until: 03/07/16 To last until: 04/06/16  . Oxycodone HCl 10 MG TABS    Sig: Take 0.5-1 tablets (5-10 mg total) by mouth daily as needed.    Dispense:  30 tablet    Refill:  0    Do not place this medication, or any other prescription from our practice, on "Automatic Refill". Patient may have prescription filled one day early if pharmacy is closed on scheduled refill date. Do not fill until: 04/06/16 To last until: 05/06/16  . Oxycodone HCl 10 MG TABS    Sig: Take 0.5-1 tablets (5-10 mg total) by mouth daily as needed.    Dispense:  30 tablet    Refill:  0    Do not place this medication, or any other prescription from our practice, on "Automatic Refill". Patient may have prescription filled one day early if pharmacy is closed on scheduled refill date. Do not fill until: 05/06/16 To last until: 06/05/16  . fentaNYL (DURAGESIC - DOSED MCG/HR) 25 MCG/HR patch    Sig: Place 1 patch (25 mcg total) onto the skin every 3 (three) days.    Dispense:  10 patch    Refill:  0    Do not place this medication, or any other prescription from our practice, on "Automatic Refill". Patient may have prescription filled one day early if pharmacy is closed on scheduled refill date. Do not fill until: 05/06/16 To last until: 06/05/16    [facility] & Procedure Ordered: Orders Placed This Encounter  Procedures  . ToxASSURE Select 13 (MW), Urine    Volume: 30 ml(s). Minimum 3 ml of urine is needed. Document temperature of fresh sample. Indications: Long term (current) use of opiate analgesic (Z79.891)  . Comprehensive metabolic panel    Standing Status: Future     Number of Occurrences: 1      Standing Expiration Date: 03/25/2016    Order Specific Question:  Has the patient fasted?    Answer:  No  . C-reactive protein    Standing Status: Future     Number of Occurrences: 1     Standing Expiration Date: 03/25/2016  . Magnesium    Standing Status: Future     Number of Occurrences: 1     Standing Expiration Date: 03/25/2016  . Sedimentation rate    Standing Status: Future     Number of Occurrences: 1     Standing Expiration Date: 03/25/2016  . Vitamin B12    Indication: Bone Pain (M89.9)    Standing Status: Future     Number of Occurrences: 1     Standing Expiration Date: 03/25/2016  . Vitamin D pnl(25-hydrxy+1,25-dihy)-bld    Standing Status:  Future     Number of Occurrences: 1     Standing Expiration Date: 03/25/2016    Imaging Ordered: None  Interventional Therapies: Scheduled: None at this time. PRN Procedures: None at this time.    Referral(s) or Consult(s): None at this time.  Medications administered during this visit: Tina Frey had no medications administered during this visit.  Future Appointments Date Time Provider [place]  05/25/2016 9:00 AM Milinda Pointer, MD [facility] None    Primary Care Physician: Glendon Axe, MD Location: [facility] Out[facility] Pain [facility] Facility Note by: Kathlen Brunswick. Dossie Arbour, M.D, DABA, DABAPM, DABPM, DABIPP, FIPP  Pain Score Disclaimer: We use the NRS-11 scale. This is a self-reported, subjective measurement of pain severity with only modest accuracy. It is used primarily to identify changes within a particular patient. It must be understood that outpatient pain scales are significantly less accurate that those used for research, where they can be applied under ideal controlled circumstances with minimal exposure to variables. In reality, the score is likely to be a combination of pain intensity and pain affect, where pain affect describes the degree of emotional arousal or changes in action readiness caused by the sensory  experience of pain. Factors such as social and work situation, setting, emotional state, anxiety levels, expectation, and prior pain experience may influence pain perception and show large inter-individual differences that may also be affected by time variables.

## 2016-02-24 NOTE — Progress Notes (Deleted)
   Subjective:    Patient ID: Tina Frey, female    DOB: [birth year], 75 y.o.   MRN: [id number]  HPI    Review of Systems     Objective:   Physical Exam        Assessment & Plan:

## 2016-02-24 NOTE — Progress Notes (Signed)
Safety precautions to be maintained throughout the outpatient stay will include: orient to surroundings, keep bed in low position, maintain call bell within reach at all times, provide assistance with transfer out of bed and ambulation.  Oxycodone = 85 pills Fentanyl patches = 0

## 2016-02-24 NOTE — Patient Instructions (Signed)
Instructed to get labwork today.

## 2016-02-25 LAB — VITAMIN D PNL(25-HYDRXY+1,25-DIHY)-BLD
Vit D, 1,25-Dihydroxy: 91.7 pg/mL — ABNORMAL HIGH (ref 19.9–79.3)
Vit D, 25-Hydroxy: 24.9 ng/mL — ABNORMAL LOW (ref 30.0–100.0)

## 2016-02-25 LAB — VITAMIN B12: Vitamin B-12: 216 pg/mL (ref 180–914)

## 2016-03-01 LAB — TOXASSURE SELECT 13 (MW), URINE: PDF: 0

## 2016-03-03 ENCOUNTER — Other Ambulatory Visit: Payer: Self-pay | Admitting: Pain Medicine

## 2016-03-03 DIAGNOSIS — E559 Vitamin D deficiency, unspecified: Secondary | ICD-10-CM | POA: Insufficient documentation

## 2016-03-03 MED ORDER — VITAMIN D (ERGOCALCIFEROL) 1.25 MG (50000 UNIT) PO CAPS: ORAL_CAPSULE | ORAL | Status: DC

## 2016-03-03 MED ORDER — VITAMIN D3 50 MCG (2000 UT) PO CAPS: ORAL_CAPSULE | ORAL | Status: DC

## 2016-03-03 NOTE — Progress Notes (Signed)
Quick Note:  Lab results reviewed and found to be within normal limits. ______ 

## 2016-03-03 NOTE — Progress Notes (Signed)

## 2016-04-08 ENCOUNTER — Other Ambulatory Visit: Payer: Self-pay | Admitting: Internal Medicine

## 2016-04-08 DIAGNOSIS — Z1231 Encounter for screening mammogram for malignant neoplasm of breast: Secondary | ICD-10-CM

## 2016-04-08 DIAGNOSIS — Z1239 Encounter for other screening for malignant neoplasm of breast: Secondary | ICD-10-CM

## 2016-04-09 ENCOUNTER — Emergency Department
Admission: EM | Admit: 2016-04-09 | Discharge: 2016-04-09 | Disposition: A | Discharge status: Home / Self Care | Payer: Medicare Other | Attending: Emergency Medicine | Admitting: Emergency Medicine

## 2016-04-09 ENCOUNTER — Encounter: Payer: Self-pay | Admitting: Emergency Medicine

## 2016-04-09 ENCOUNTER — Emergency Department: Payer: Medicare Other

## 2016-04-09 DIAGNOSIS — M79672 Pain in left foot: Secondary | ICD-10-CM | POA: Diagnosis not present

## 2016-04-09 DIAGNOSIS — E119 Type 2 diabetes mellitus without complications: Secondary | ICD-10-CM | POA: Diagnosis not present

## 2016-04-09 DIAGNOSIS — J449 Chronic obstructive pulmonary disease, unspecified: Secondary | ICD-10-CM | POA: Insufficient documentation

## 2016-04-09 DIAGNOSIS — M1990 Unspecified osteoarthritis, unspecified site: Secondary | ICD-10-CM | POA: Diagnosis not present

## 2016-04-09 DIAGNOSIS — I10 Essential (primary) hypertension: Secondary | ICD-10-CM | POA: Diagnosis not present

## 2016-04-09 DIAGNOSIS — Z7982 Long term (current) use of aspirin: Secondary | ICD-10-CM | POA: Insufficient documentation

## 2016-04-09 MED ORDER — NAPROXEN 500 MG PO TABS: 500.0000 mg | ORAL_TABLET | Freq: Two times a day (BID) | ORAL | Status: DC

## 2016-04-09 NOTE — ED Provider Notes (Signed)
[facility] Emergency Department Provider Note  ____________________________________________  Time seen: Approximately 10:02 AM  I have reviewed the triage vital signs and the nursing notes.   HISTORY  Chief Complaint Foot Pain   HPI Tina Frey is a 75 y.o. female is here with complaint of left foot pain which is worse with walking. She states her foot is been hurting for approximately 2-3 weeks. Patient states it was no history of an injury. She denies any swelling or redness. She has had bunion surgery in the past on her foot but no other surgeries. She states she currently is waiting for a appointment with podiatry. She is concerned because it "feels like my bones are moving in my foot".Patient has continued to be ambulatory. She currently is taking that now patches and oxycodone for chronic pain but states this does not help her foot. Occasionally she has taken Aleve with some improvement. She has not taken it on a frequent basis. She denies any GI difficulties. Probably she rates her pain as an 8 out of 10.   Past Medical History  Diagnosis Date  . GERD (gastroesophageal reflux disease)   . Cataract   . Allergy   . Arthritis   . COPD (chronic obstructive pulmonary disease) ([place])   . Hypertension   . Hyperlipidemia     Patient Active Problem List   Diagnosis Date Noted  . Vitamin D insufficiency 03/03/2016  . Chronic lower extremity pain (Location of Primary Source of Pain) (S1 Dermatome) (Left) 02/24/2016  . Levoscoliosis 11/30/2015  . Lumbar facet hypertrophy (L2-3, L3-4, L4-L5, L5-S1) 11/30/2015  . Thoracic disc herniation (T12-L1 disc protrusion) 11/30/2015  . Lumbar disc bulging (L2-3, L3-4, and L4-5) 11/30/2015  . Lumbar disc herniation  (Right) (L1-2: Small right-sided disc protrusion with upgoing disc material) 11/30/2015  . H/O total knee replacement 10/16/2015  . Encounter for therapeutic drug level monitoring 10/12/2015  . Long term  current use of opiate analgesic 10/12/2015  . Long term prescription opiate use 10/12/2015  . Uncomplicated opioid dependence ([place]) 10/12/2015  . Opiate use (90 MME/Day) 10/12/2015  . Lumbar facet syndrome (L>R) 10/12/2015  . Lumbosacral radiculopathy (Left) (S1 Dermatome) 10/12/2015  . Lumbar spondylosis 10/12/2015  . Chronic low back pain 10/12/2015  . Chronic pain 10/12/2015  . AI (aortic incompetence) 09/23/2015  . Benign essential HTN 08/20/2015  . Awareness of heartbeats 03/05/2015  . Combined fat and carbohydrate induced hyperlipemia 11/16/2014  . MI (mitral incompetence) 11/10/2014  . 3-vessel CAD 10/23/2014  . Chronic obstructive pulmonary disease ([place]) 10/10/2014  . Clinical depression 10/10/2014  . 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 Abdominal hysterectomy    . Appendectomy    . Hernia repair    . Joint replacement      knee replacement  . Breast surgery    . Cholecystectomy    . Hernia repair    . Bunionectomy      right  . [facility] joint repair      left  . Carpal tunnel release      left  . Cataract extraction      left  . Cardiac catheterization    . Biopsy lip    . Arthroscopy knee w/ drilling    . Replacement total knee      left  . Carpal tunnel release    . Right shoulder surgery      Current Outpatient Rx  Name  Route  Sig  Dispense  Refill  . ALBUTEROL IN   Inhalation   Inhale into the lungs.         Marland Kitchen alendronate (FOSAMAX) 70 MG tablet   Oral   Take 70 mg by mouth once a week. Take with a full glass of water on an empty stomach.         Marland Kitchen aspirin 81 MG chewable tablet   Oral   Chew 81 mg by mouth daily.         Marland Kitchen atorvastatin (LIPITOR) 20 MG tablet   Oral   Take 20 mg by mouth daily.         . Biotin 5000 MCG CAPS   Oral   Take 5,000 capsules by mouth daily.         . calcium-vitamin D (OSCAL WITH D) 500-200 MG-UNIT tablet   Oral   Take 1 tablet by mouth  daily.         . Cholecalciferol (VITAMIN D3) 2000 units capsule      Take 1 capsule (2,000 Units total) by mouth daily.   30 capsule   PRN     Do not place this medication, or any other prescri ...   . fentaNYL (DURAGESIC - DOSED MCG/HR) 25 MCG/HR patch   Transdermal   Place 1 patch (25 mcg total) onto the skin every 3 (three) days.   10 patch   0     Do not place this medication, or any other prescri ...   . fentaNYL (DURAGESIC - DOSED MCG/HR) 25 MCG/HR patch   Transdermal   Place 1 patch (25 mcg total) onto the skin every 3 (three) days.   10 patch   0     Do not place this medication, or any other prescri ...   . fentaNYL (DURAGESIC - DOSED MCG/HR) 25 MCG/HR patch   Transdermal   Place 1 patch (25 mcg total) onto the skin every 3 (three) days.   10 patch   0     Do not place this medication, or any other prescri ...   . fluticasone (FLONASE) 50 MCG/ACT nasal spray   Each Nare   Place 2 sprays into both nostrils daily.         Marland Kitchen lisinopril (PRINIVIL,ZESTRIL) 10 MG tablet   Oral   Take 10 mg by mouth daily.         Marland Kitchen loratadine (CLARITIN) 10 MG tablet   Oral   Take 10 mg by mouth daily.         . metoprolol succinate (TOPROL-XL) 25 MG 24 hr tablet   Oral   Take 25 mg by mouth 2 (two) times daily.         . Multiple Vitamins-Minerals (PRESERVISION AREDS PO)   Oral   Take 2 capsules by mouth daily.         . naproxen (NAPROSYN) 500 MG tablet   Oral   Take 1 tablet (500 mg total) by mouth 2 (two) times daily with a meal.   30 tablet   0   . Oxycodone HCl 10 MG TABS   Oral   Take 0.5-1 tablets (5-10 mg total) by mouth daily as needed.   30 tablet   0     Do not place this medication, or any other prescri ...   . Oxycodone HCl 10 MG TABS   Oral   Take 0.5-1 tablets (5-10 mg total) by mouth daily as needed.  30 tablet   0     Do not place this medication, or any other prescri ...   . Oxycodone HCl 10 MG TABS   Oral   Take 0.5-1  tablets (5-10 mg total) by mouth daily as needed.   30 tablet   0     Do not place this medication, or any other prescri ...   . pantoprazole (PROTONIX) 40 MG tablet   Oral   Take 40 mg by mouth daily.         . sertraline (ZOLOFT) 50 MG tablet   Oral   Take 50 mg by mouth daily.         Marland Kitchen tiotropium (SPIRIVA) 18 MCG inhalation capsule   Inhalation   Place 18 mcg into inhaler and inhale daily.         . Vitamin D, Ergocalciferol, (DRISDOL) 50000 units CAPS capsule      Take 1 capsule (50,000 Units total) by mouth 2 (two) times a week. X 6 weeks.   12 capsule   0     Do not place this medication, or any other prescri ...     Allergies Cephalexin; Ciprofloxacin; Morphine and related; and Sulfa antibiotics  Family History  Problem Relation Age of Onset  . Cancer Mother   . Heart disease Father   . Heart disease Brother     Social History Social History  Substance Use Topics  . Smoking status: Never Smoker   . Smokeless tobacco: None  . Alcohol Use: No    Review of Systems Constitutional: No fever/chills Cardiovascular: Denies chest pain. Respiratory: Denies shortness of breath. Gastrointestinal:  No nausea, no vomiting. Musculoskeletal: Positive for left foot pain. Skin: Negative for rash. Neurological: Negative for headaches, focal weakness or numbness.  10-point ROS otherwise negative.  ____________________________________________   PHYSICAL EXAM:  VITAL SIGNS: ED Triage Vitals  Enc Vitals Group     BP 04/09/16 0938 102/82 mmHg     Pulse Rate 04/09/16 0938 68     Resp 04/09/16 0938 18     Temp 04/09/16 0938 99.1 F (37.3 C)     Temp Source 04/09/16 0938 Oral     SpO2 04/09/16 0938 95 %     Weight 04/09/16 0938 155 lb (70.308 kg)     Height 04/09/16 0938 5\' 2"  (1.575 m)     Head Cir --      Peak Flow --      Pain Score 04/09/16 0938 8     Pain Loc --      Pain Edu? --      Excl. in [place]? --     Constitutional: Alert and oriented.  Well appearing and in no acute distress. Eyes: Conjunctivae are normal. PERRL. EOMI. Head: Atraumatic. Nose: No congestion/rhinnorhea. Neck: No stridor.   Cardiovascular: Normal rate, regular rhythm. Grossly normal heart sounds.  Good peripheral circulation. Respiratory: Normal respiratory effort.  No retractions. Lungs CTAB. Musculoskeletal: Left foot there is no gross deformity noted. There is moderate tenderness on palpation of the plantar aspect of the left foot and heel. There is associated marked tenderness on palpation of the arch area about edema. There is no edema, no erythema, no ecchymosis. Motor sensory function intact. Pulse within normal limits. Skin is warm and dry Neurologic:  Normal speech and language. No gross focal neurologic deficits are appreciated. No gait instability. Skin:  Skin is warm, dry and intact. As noted above. Psychiatric: Mood and affect are normal. Speech and  behavior are normal.  ____________________________________________   LABS (all labs ordered are listed, but only abnormal results are displayed)  Labs Reviewed - No data to display RADIOLOGY  Left foot per radiologist shows no fracture dislocation. ____________________________________________   PROCEDURES  Procedure(s) performed: None  Critical Care performed: No  ____________________________________________   INITIAL IMPRESSION / ASSESSMENT AND PLAN / ED COURSE  Pertinent labs & imaging results that were available during my care of the patient were reviewed by me and considered in my medical decision making (see chart for details).  Patient is to follow-up with Dr. Vickki Muff on May 2 for her appointment. In the meantime she was given a prescription for naproxen 500 mg twice a day with food. Patient is warned to discontinue medication if any stomach upset. She will continue to take her regular pain medication as directed. ____________________________________________   FINAL CLINICAL  IMPRESSION(S) / ED DIAGNOSES  Final diagnoses:  Acute pain of left foot      Johnn Hai, PA-C 04/09/16 1200  Delman Kitten, MD 04/09/16 1451

## 2016-04-09 NOTE — Discharge Instructions (Signed)
Continue regular medication as prescribed. Take naproxen 500 mg twice a day with food. Discontinue taking this medication if there is any abdominal pain or discomfort. Keep your appointment with the podiatrist. Ace wrap and wooden shoe. At night tried taking a bottle of water that is frozen and roll your foot over this while watching TV. Avoid walking or standing for long periods of time.

## 2016-04-09 NOTE — ED Notes (Signed)
Patient presents to the ED with left heel pain that is worse with walking.  Patient states, "I feel like my bones are moving around in my foot."  Patient reports difficulty getting an appt. With podiatry.  Patient denies history of blood clots.  Patient denies swelling or redness.  Patient denies trauma to foot.

## 2016-04-09 NOTE — ED Notes (Signed)
Waiting for printed discharge paperwork.

## 2016-04-21 ENCOUNTER — Ambulatory Visit: Payer: Medicare Other

## 2016-04-27 ENCOUNTER — Ambulatory Visit: Payer: Medicare Other | Admitting: Podiatry

## 2016-05-04 ENCOUNTER — Other Ambulatory Visit: Payer: Self-pay | Admitting: Internal Medicine

## 2016-05-04 ENCOUNTER — Ambulatory Visit
Admission: RE | Admit: 2016-05-04 | Discharge: 2016-05-04 | Disposition: A | Discharge status: Home / Self Care | Payer: Medicare Other | Source: Ambulatory Visit | Attending: Internal Medicine | Admitting: Internal Medicine

## 2016-05-04 DIAGNOSIS — Z1231 Encounter for screening mammogram for malignant neoplasm of breast: Secondary | ICD-10-CM

## 2016-05-25 ENCOUNTER — Encounter: Payer: Medicare Other | Admitting: Pain Medicine

## 2016-07-18 ENCOUNTER — Encounter: Payer: Self-pay | Admitting: *Deleted

## 2016-07-19 ENCOUNTER — Ambulatory Visit
Admission: RE | Admit: 2016-07-19 | Discharge: 2016-07-19 | Disposition: A | Discharge status: Home / Self Care | Payer: Medicare Other | Source: Ambulatory Visit | Attending: Gastroenterology | Admitting: Gastroenterology

## 2016-07-19 ENCOUNTER — Ambulatory Visit: Payer: Medicare Other | Admitting: Anesthesiology

## 2016-07-19 ENCOUNTER — Encounter
Admission: RE | Disposition: A | Discharge status: Home / Self Care | Payer: Self-pay | Source: Ambulatory Visit | Attending: Gastroenterology

## 2016-07-19 DIAGNOSIS — Z1211 Encounter for screening for malignant neoplasm of colon: Secondary | ICD-10-CM | POA: Diagnosis not present

## 2016-07-19 DIAGNOSIS — Z882 Allergy status to sulfonamides status: Secondary | ICD-10-CM | POA: Insufficient documentation

## 2016-07-19 DIAGNOSIS — J449 Chronic obstructive pulmonary disease, unspecified: Secondary | ICD-10-CM | POA: Insufficient documentation

## 2016-07-19 DIAGNOSIS — E785 Hyperlipidemia, unspecified: Secondary | ICD-10-CM | POA: Insufficient documentation

## 2016-07-19 DIAGNOSIS — D122 Benign neoplasm of ascending colon: Secondary | ICD-10-CM | POA: Insufficient documentation

## 2016-07-19 DIAGNOSIS — K219 Gastro-esophageal reflux disease without esophagitis: Secondary | ICD-10-CM | POA: Insufficient documentation

## 2016-07-19 DIAGNOSIS — Z79899 Other long term (current) drug therapy: Secondary | ICD-10-CM | POA: Diagnosis not present

## 2016-07-19 DIAGNOSIS — I2510 Atherosclerotic heart disease of native coronary artery without angina pectoris: Secondary | ICD-10-CM | POA: Insufficient documentation

## 2016-07-19 DIAGNOSIS — I10 Essential (primary) hypertension: Secondary | ICD-10-CM | POA: Insufficient documentation

## 2016-07-19 DIAGNOSIS — G709 Myoneural disorder, unspecified: Secondary | ICD-10-CM | POA: Insufficient documentation

## 2016-07-19 DIAGNOSIS — Z87891 Personal history of nicotine dependence: Secondary | ICD-10-CM | POA: Diagnosis not present

## 2016-07-19 DIAGNOSIS — F329 Major depressive disorder, single episode, unspecified: Secondary | ICD-10-CM | POA: Insufficient documentation

## 2016-07-19 DIAGNOSIS — D123 Benign neoplasm of transverse colon: Secondary | ICD-10-CM | POA: Diagnosis not present

## 2016-07-19 DIAGNOSIS — M1990 Unspecified osteoarthritis, unspecified site: Secondary | ICD-10-CM | POA: Insufficient documentation

## 2016-07-19 DIAGNOSIS — K5730 Diverticulosis of large intestine without perforation or abscess without bleeding: Secondary | ICD-10-CM | POA: Insufficient documentation

## 2016-07-19 DIAGNOSIS — Z7982 Long term (current) use of aspirin: Secondary | ICD-10-CM | POA: Insufficient documentation

## 2016-07-19 DIAGNOSIS — I341 Nonrheumatic mitral (valve) prolapse: Secondary | ICD-10-CM | POA: Insufficient documentation

## 2016-07-19 HISTORY — PX: COLONOSCOPY WITH PROPOFOL: SHX5780

## 2016-07-19 HISTORY — DX: Nonrheumatic mitral (valve) prolapse: I34.1

## 2016-07-19 SURGERY — COLONOSCOPY WITH PROPOFOL
Anesthesia: General

## 2016-07-19 MED ORDER — LIDOCAINE HCL (PF) 2 % IJ SOLN
INTRAMUSCULAR | Status: DC | PRN
  Administered 2016-07-19: 50 mg via INTRADERMAL

## 2016-07-19 MED ORDER — SODIUM CHLORIDE 0.9 % IV SOLN
INTRAVENOUS | Status: DC
  Administered 2016-07-19: 1000 mL via INTRAVENOUS

## 2016-07-19 MED ORDER — PROPOFOL 500 MG/50ML IV EMUL
INTRAVENOUS | Status: DC | PRN
  Administered 2016-07-19: 75 ug/kg/min via INTRAVENOUS

## 2016-07-19 MED ORDER — PROPOFOL 10 MG/ML IV BOLUS
INTRAVENOUS | Status: DC | PRN
  Administered 2016-07-19 (×2): 30 mg via INTRAVENOUS
  Administered 2016-07-19: 40 mg via INTRAVENOUS
  Administered 2016-07-19: 30 mg via INTRAVENOUS

## 2016-07-19 MED ORDER — SODIUM CHLORIDE 0.9 % IV SOLN: INTRAVENOUS | Status: DC

## 2016-07-19 NOTE — H&P (Signed)
Outpatient short stay form Pre-procedure 07/19/2016 11:13 AM Tina Sails MD  Primary Physician: Dr. Glendon Axe  Reason for visit:  Colonoscopy  History of present illness:  Patient is a 75 year old female presenting today as above. Her last colonoscopy was about 10 years ago. There are no findings on that. She does take 81 mg aspirin is held that for a few days. She tolerated her prep well.    Current Facility-Administered Medications:  .  0.9 %  sodium chloride infusion, , Intravenous, Continuous, Tina Sails, MD, Last Rate: 20 mL/hr at 07/19/16 1044, 1,000 mL at 07/19/16 1044 .  0.9 %  sodium chloride infusion, , Intravenous, Continuous, Tina Sails, MD  Prescriptions Prior to Admission  Medication Sig Dispense Refill Last Dose  . aspirin 81 MG chewable tablet Chew 81 mg by mouth daily.   Past Week at Unknown time  . metoprolol succinate (TOPROL-XL) 25 MG 24 hr tablet Take 25 mg by mouth 2 (two) times daily.   07/19/2016 at 0630  . oxybutynin (DITROPAN-XL) 10 MG 24 hr tablet Take 10 mg by mouth at bedtime.   07/19/2016 at 0630  . sertraline (ZOLOFT) 50 MG tablet Take 50 mg by mouth daily.   07/19/2016 at 0630  . ALBUTEROL IN Inhale into the lungs.   Taking  . alendronate (FOSAMAX) 70 MG tablet Take 70 mg by mouth once a week. Take with a full glass of water on an empty stomach.   Not Taking at Unknown time  . atorvastatin (LIPITOR) 20 MG tablet Take 20 mg by mouth daily.   Taking  . Biotin 5000 MCG CAPS Take 5,000 capsules by mouth daily.   Taking  . calcium-vitamin D (OSCAL WITH D) 500-200 MG-UNIT tablet Take 1 tablet by mouth daily.   Taking  . Cholecalciferol (VITAMIN D3) 2000 units capsule Take 1 capsule (2,000 Units total) by mouth daily. 30 capsule PRN   . fentaNYL (DURAGESIC - DOSED MCG/HR) 25 MCG/HR patch Place 1 patch (25 mcg total) onto the skin every 3 (three) days. 10 patch 0   . fentaNYL (DURAGESIC - DOSED MCG/HR) 25 MCG/HR patch Place 1 patch (25 mcg total)  onto the skin every 3 (three) days. 10 patch 0   . fentaNYL (DURAGESIC - DOSED MCG/HR) 25 MCG/HR patch Place 1 patch (25 mcg total) onto the skin every 3 (three) days. 10 patch 0   . fluticasone (FLONASE) 50 MCG/ACT nasal spray Place 2 sprays into both nostrils daily.   Taking  . lisinopril (PRINIVIL,ZESTRIL) 10 MG tablet Take 10 mg by mouth daily.   Taking  . loratadine (CLARITIN) 10 MG tablet Take 10 mg by mouth daily.   Taking  . Multiple Vitamins-Minerals (PRESERVISION AREDS PO) Take 2 capsules by mouth daily.   Taking  . naproxen (NAPROSYN) 500 MG tablet Take 1 tablet (500 mg total) by mouth 2 (two) times daily with a meal. 30 tablet 0   . pantoprazole (PROTONIX) 40 MG tablet Take 40 mg by mouth daily.   Taking  . tiotropium (SPIRIVA) 18 MCG inhalation capsule Place 18 mcg into inhaler and inhale daily.   Taking  . Vitamin D, Ergocalciferol, (DRISDOL) 50000 units CAPS capsule Take 1 capsule (50,000 Units total) by mouth 2 (two) times a week. X 6 weeks. 12 capsule 0      Allergies  Allergen Reactions  . Cephalexin Nausea Only  . Ciprofloxacin Nausea And Vomiting  . Morphine And Related Nausea And Vomiting  . Sulfa Antibiotics  Other (See Comments)    Causes yeast infections      Past Medical History:  Diagnosis Date  . Allergy   . Arthritis   . Cataract   . COPD (chronic obstructive pulmonary disease) ([place])   . GERD (gastroesophageal reflux disease)   . Hyperlipidemia   . Hypertension   . Mitral valve prolapse     Review of systems:      Physical Exam    Heart and lungs: Regular rate and rhythm without rub or gallop, lungs are bilaterally clear.    HEENT: Normocephalic atraumatic eyes are anicteric    Other:     Pertinant exam for procedure: soft nontender nondistended bowel sounds positive normoactive    Planned proceedures: Colonoscopy and indicated procedures. I have discussed the risks benefits and complications of procedures to include not limited to  bleeding, infection, perforation and the risk of sedation and the patient wishes to proceed.    Tina Sails, MD Gastroenterology 07/19/2016  11:13 AM

## 2016-07-19 NOTE — Anesthesia Postprocedure Evaluation (Signed)
Anesthesia Post Note  Patient: Reginia Forts  Procedure(s) Performed: Procedure(s) (LRB): COLONOSCOPY WITH PROPOFOL (N/A)  Patient location during evaluation: PACU Anesthesia Type: General Level of consciousness: awake and alert and oriented Pain management: pain level controlled Vital Signs Assessment: post-procedure vital signs reviewed and stable Respiratory status: spontaneous breathing, nonlabored ventilation and respiratory function stable Cardiovascular status: blood pressure returned to baseline and stable Postop Assessment: no signs of nausea or vomiting Anesthetic complications: no    Last Vitals:  Vitals:   07/19/16 1224 07/19/16 1244  BP: 123/80   Pulse:    Resp:  17  Temp:      Last Pain:  Vitals:   07/19/16 1214  TempSrc: Tympanic                 Genora Arp

## 2016-07-19 NOTE — Transfer of Care (Signed)
Immediate Anesthesia Transfer of Care Note  Patient: Tina Frey  Procedure(s) Performed: Procedure(s): COLONOSCOPY WITH PROPOFOL (N/A)  Patient Location: PACU  Anesthesia Type:General  Level of Consciousness: awake  Airway & Oxygen Therapy: Patient Spontanous Breathing  Post-op Assessment: Report given to RN and Post -op Vital signs reviewed and stable  Post vital signs: Reviewed and stable  Last Vitals:  Vitals:   07/19/16 1025  BP: 133/69  Pulse: 71  Resp: 17  Temp: 36.4 C    Last Pain:  Vitals:   07/19/16 1025  TempSrc: Tympanic         Complications: No apparent anesthesia complications

## 2016-07-19 NOTE — Anesthesia Preprocedure Evaluation (Addendum)
Anesthesia Evaluation  Patient identified by MRN, date of birth, ID band Patient awake    Reviewed: Allergy & Precautions, NPO status , Patient's Chart, lab work & pertinent test results, reviewed documented beta blocker date and time   History of Anesthesia Complications Negative for: history of anesthetic complications  Airway Mallampati: II  TM Distance: >3 FB Neck ROM: Full    Dental  (+) Upper Dentures, Partial Lower   Pulmonary neg sleep apnea, COPD (mild),  COPD inhaler, former smoker,    breath sounds clear to auscultation- rhonchi (-) decreased breath sounds(-) wheezing      Cardiovascular hypertension, + CAD (based on cardiac cath 2007)  (-) Past MI and (-) Cardiac Stents  Rhythm:Regular Rate:Normal - Systolic murmurs and - Diastolic murmurs NM Perfusion scan 05/24/16: LVEF= 54% FINDINGS: Regional wall motion:reveals normal myocardial thickening and wall  motion. The overall quality of the study is good. Artifacts noted: no Left ventricular cavity: normal.  Echo 05/24/16: LVEF 50%, no significant valvular abnormalities   Neuro/Psych Depression  Neuromuscular disease (chronic back pain)    GI/Hepatic Neg liver ROS, GERD  Medicated,  Endo/Other  negative endocrine ROSneg diabetes  Renal/GU negative Renal ROS     Musculoskeletal  (+) Arthritis , Osteoarthritis,    Abdominal (+) - obese,   Peds  Hematology negative hematology ROS (+)   Anesthesia Other Findings Past Medical History: No date: Allergy No date: Arthritis No date: Cataract No date: COPD (chronic obstructive pulmonary disease) (* No date: GERD (gastroesophageal reflux disease) No date: Hyperlipidemia No date: Hypertension No date: Mitral valve prolapse   Reproductive/Obstetrics                            Anesthesia Physical Anesthesia Plan  ASA: III  Anesthesia Plan: General   Post-op Pain Management:     Induction: Intravenous  Airway Management Planned: Natural Airway  Additional Equipment:   Intra-op Plan:   Post-operative Plan:   Informed Consent: I have reviewed the patients History and Physical, chart, labs and discussed the procedure including the risks, benefits and alternatives for the proposed anesthesia with the patient or authorized representative who has indicated his/her understanding and acceptance.     Plan Discussed with: Anesthesiologist  Anesthesia Plan Comments:         Anesthesia Quick Evaluation

## 2016-07-19 NOTE — Op Note (Signed)
[facility] Gastroenterology Patient Name: Tina Frey Procedure Date: 07/19/2016 11:16 AM MRN: [id number] Account #: [id number] Date of Birth: 1941-04-15 Admit Type: Outpatient Age: 75 Room: [facility] ENDO ROOM 3 Gender: Female Note Status: Finalized Procedure:            Colonoscopy Indications:          Screening for colorectal malignant neoplasm Providers:            Lollie Sails, MD Referring MD:         Glendon Axe (Referring MD) Medicines:            Monitored Anesthesia Care Complications:        No immediate complications. Procedure:            Pre-Anesthesia Assessment:                       - ASA Grade Assessment: III - A patient with severe                        systemic disease.                       After obtaining informed consent, the colonoscope was                        passed under direct vision. Throughout the procedure,                        the patient's blood pressure, pulse, and oxygen                        saturations were monitored continuously. The                        Colonoscope was introduced through the anus and                        advanced to the the cecum, identified by appendiceal                        orifice and ileocecal valve. The colonoscopy was                        unusually difficult due to restricted mobility of the                        colon, significant looping and a tortuous colon.                        Successful completion of the procedure was aided by                        changing the patient to a supine position, changing the                        patient to a prone position, using manual pressure and                        withdrawing and reinserting the scope. The patient  tolerated the procedure well. The quality of the bowel                        preparation was good. Findings:      A 4 mm polyp was found in the transverse colon. The polyp was sessile.       The polyp  was removed with a cold biopsy forceps. Resection and       retrieval were complete.      A 3 mm polyp was found in the ascending colon. The polyp was sessile.       The polyp was removed with a cold biopsy forceps. Resection and       retrieval were complete.      The sigmoid colon, descending colon and transverse colon were       significantly redundant.      A few small-mouthed diverticula were found in the sigmoid colon and       distal descending colon.      The digital rectal exam was normal. Impression:           - One 4 mm polyp in the transverse colon, removed with                        a cold biopsy forceps. Resected and retrieved.                       - One 3 mm polyp in the ascending colon, removed with a                        cold biopsy forceps. Resected and retrieved.                       - Redundant colon.                       - Diverticulosis in the sigmoid colon and in the distal                        descending colon. Recommendation:       - Discharge patient to home.                       - Telephone GI clinic for pathology results in 1 week. Procedure Code(s):    --- Professional ---                       [phone], Colonoscopy, flexible; with biopsy, single or                        multiple Diagnosis Code(s):    --- Professional ---                       Z12.11, Encounter for screening for malignant neoplasm                        of colon                       D12.3, Benign neoplasm of transverse colon (hepatic  flexure or splenic flexure)                       D12.2, Benign neoplasm of ascending colon                       K57.30, Diverticulosis of large intestine without                        perforation or abscess without bleeding                       Q43.8, Other specified congenital malformations of                        intestine CPT copyright 2016 [employer]. All rights reserved. The codes documented in this  report are preliminary and upon coder review may  be revised to meet current compliance requirements. Lollie Sails, MD 07/19/2016 12:09:34 PM This report has been signed electronically. Number of Addenda: 0 Note Initiated On: 07/19/2016 11:16 AM Scope Withdrawal Time: 0 hours 6 minutes 47 seconds  Total Procedure Duration: 0 hours 42 minutes 51 seconds       [facility]

## 2016-07-20 ENCOUNTER — Encounter: Payer: Self-pay | Admitting: Gastroenterology

## 2016-07-20 LAB — SURGICAL PATHOLOGY

## 2016-08-31 ENCOUNTER — Other Ambulatory Visit: Payer: Self-pay | Admitting: Unknown Physician Specialty

## 2016-08-31 DIAGNOSIS — H9209 Otalgia, unspecified ear: Secondary | ICD-10-CM

## 2016-09-14 ENCOUNTER — Ambulatory Visit
Admission: RE | Admit: 2016-09-14 | Discharge: 2016-09-14 | Disposition: A | Discharge status: Home / Self Care | Payer: Medicare Other | Source: Ambulatory Visit | Attending: Unknown Physician Specialty | Admitting: Unknown Physician Specialty

## 2016-09-14 DIAGNOSIS — H7092 Unspecified mastoiditis, left ear: Secondary | ICD-10-CM | POA: Insufficient documentation

## 2016-09-14 DIAGNOSIS — M1990 Unspecified osteoarthritis, unspecified site: Secondary | ICD-10-CM | POA: Insufficient documentation

## 2016-09-14 DIAGNOSIS — G5752 Tarsal tunnel syndrome, left lower limb: Secondary | ICD-10-CM | POA: Diagnosis present

## 2016-09-14 DIAGNOSIS — H9209 Otalgia, unspecified ear: Secondary | ICD-10-CM

## 2016-09-14 LAB — POCT I-STAT CREATININE: Creatinine, Ser: 0.7 mg/dL (ref 0.44–1.00)

## 2016-09-14 MED ORDER — GADOBENATE DIMEGLUMINE 529 MG/ML IV SOLN
15.0000 mL | Freq: Once | INTRAVENOUS | Status: AC | PRN
  Administered 2016-09-14: 14 mL via INTRAVENOUS

## 2016-09-23 ENCOUNTER — Other Ambulatory Visit: Payer: Self-pay | Admitting: Podiatry

## 2016-09-23 DIAGNOSIS — G5752 Tarsal tunnel syndrome, left lower limb: Secondary | ICD-10-CM

## 2016-10-05 ENCOUNTER — Ambulatory Visit
Admission: RE | Admit: 2016-10-05 | Discharge: 2016-10-05 | Disposition: A | Discharge status: Home / Self Care | Payer: Medicare Other | Source: Ambulatory Visit | Attending: Podiatry | Admitting: Podiatry

## 2016-10-05 DIAGNOSIS — M2140 Flat foot [pes planus] (acquired), unspecified foot: Secondary | ICD-10-CM | POA: Diagnosis not present

## 2016-10-05 DIAGNOSIS — G5752 Tarsal tunnel syndrome, left lower limb: Secondary | ICD-10-CM

## 2016-10-05 DIAGNOSIS — M7750 Other enthesopathy of unspecified foot: Secondary | ICD-10-CM | POA: Diagnosis not present

## 2016-10-26 ENCOUNTER — Other Ambulatory Visit: Payer: Self-pay | Admitting: Unknown Physician Specialty

## 2016-10-26 DIAGNOSIS — H9202 Otalgia, left ear: Secondary | ICD-10-CM

## 2016-11-02 ENCOUNTER — Ambulatory Visit
Admission: RE | Admit: 2016-11-02 | Discharge: 2016-11-02 | Disposition: A | Discharge status: Home / Self Care | Payer: Medicare Other | Source: Ambulatory Visit | Attending: Unknown Physician Specialty | Admitting: Unknown Physician Specialty

## 2016-11-02 DIAGNOSIS — H9202 Otalgia, left ear: Secondary | ICD-10-CM

## 2016-11-02 DIAGNOSIS — H7092 Unspecified mastoiditis, left ear: Secondary | ICD-10-CM | POA: Diagnosis not present

## 2016-11-02 DIAGNOSIS — H9209 Otalgia, unspecified ear: Secondary | ICD-10-CM | POA: Diagnosis present

## 2016-11-17 NOTE — Discharge Instructions (Signed)
[facility] °[facility] ° °POST OPERATIVE INSTRUCTIONS FOR DR. TROXLER AND DR. FOWLER °[facility] ° ° °1. Take your medication as prescribed.  Pain medication should be taken only as needed. ° °2. Keep the dressing clean, dry and intact. ° °3. Keep your foot elevated above the heart level for the first 48 hours. ° °4. Walking to the bathroom and brief periods of walking are acceptable, unless we have instructed you to be non-weight bearing. ° °5. Always wear your post-op shoe when walking.  Always use your crutches if you are to be non-weight bearing. ° °6. Do not take a shower. Baths are permissible as long as the foot is kept out of the water.  ° °7. Every hour you are awake:  °- Bend your knee 15 times. °- Flex foot 15 times °- Massage calf 15 times ° °8. Call [facility] ([phone]) if any of the following problems occur: °- You develop a temperature or fever. °- The bandage becomes saturated with blood. °- Medication does not stop your pain. °- Injury of the foot occurs. °- Any symptoms of infection including redness, odor, or red streaks running from wound. ° ° °General Anesthesia, Adult, Care After °These instructions provide you with information about caring for yourself after your procedure. Your health care provider may also give you more specific instructions. Your treatment has been planned according to current medical practices, but problems sometimes occur. Call your health care provider if you have any problems or questions after your procedure. °What can I expect after the procedure? °After the procedure, it is common to have: °· Vomiting. °· A sore throat. °· Mental slowness. °It is common to feel: °· Nauseous. °· Cold or shivery. °· Sleepy. °· Tired. °· Sore or achy, even in parts of your body where you did not have surgery. °Follow these instructions at home: °For at least 24 hours after the procedure: °· Do not: °¨ Participate in  activities where you could fall or become injured. °¨ Drive. °¨ Use heavy machinery. °¨ Drink alcohol. °¨ Take sleeping pills or medicines that cause drowsiness. °¨ Make important decisions or sign legal documents. °¨ Take care of children on your own. °· Rest. °Eating and drinking °· If you vomit, drink water, juice, or soup when you can drink without vomiting. °· Drink enough fluid to keep your urine clear or pale yellow. °· Make sure you have little or no nausea before eating solid foods. °· Follow the diet recommended by your health care provider. °General instructions °· Have a responsible adult stay with you until you are awake and alert. °· Return to your normal activities as told by your health care provider. Ask your health care provider what activities are safe for you. °· Take over-the-counter and prescription medicines only as told by your health care provider. °· If you smoke, do not smoke without supervision. °· Keep all follow-up visits as told by your health care provider. This is important. °Contact a health care provider if: °· You continue to have nausea or vomiting at home, and medicines are not helpful. °· You cannot drink fluids or start eating again. °· You cannot urinate after 8-12 hours. °· You develop a skin rash. °· You have fever. °· You have increasing redness at the site of your procedure. °Get help right away if: °· You have difficulty breathing. °· You have chest pain. °· You have unexpected bleeding. °· You feel that you are having   a life-threatening or urgent problem. °This information is not intended to replace advice given to you by your health care provider. Make sure you discuss any questions you have with your health care provider. °Document Released: 03/13/2001 Document Revised: 05/09/2016 Document Reviewed: 11/19/2015 °Elsevier Interactive Patient Education © 2017 [employer] ° °

## 2016-11-23 ENCOUNTER — Encounter
Admission: RE | Disposition: A | Discharge status: Home / Self Care | Payer: Self-pay | Source: Ambulatory Visit | Attending: Podiatry

## 2016-11-23 ENCOUNTER — Ambulatory Visit: Payer: Medicare Other | Admitting: Anesthesiology

## 2016-11-23 ENCOUNTER — Ambulatory Visit
Admission: RE | Admit: 2016-11-23 | Discharge: 2016-11-23 | Disposition: A | Discharge status: Home / Self Care | Payer: Medicare Other | Source: Ambulatory Visit | Attending: Podiatry | Admitting: Podiatry

## 2016-11-23 DIAGNOSIS — J449 Chronic obstructive pulmonary disease, unspecified: Secondary | ICD-10-CM | POA: Insufficient documentation

## 2016-11-23 DIAGNOSIS — Z87891 Personal history of nicotine dependence: Secondary | ICD-10-CM | POA: Insufficient documentation

## 2016-11-23 DIAGNOSIS — M722 Plantar fascial fibromatosis: Secondary | ICD-10-CM | POA: Insufficient documentation

## 2016-11-23 DIAGNOSIS — K219 Gastro-esophageal reflux disease without esophagitis: Secondary | ICD-10-CM | POA: Diagnosis not present

## 2016-11-23 DIAGNOSIS — G5752 Tarsal tunnel syndrome, left lower limb: Secondary | ICD-10-CM | POA: Diagnosis not present

## 2016-11-23 DIAGNOSIS — I10 Essential (primary) hypertension: Secondary | ICD-10-CM | POA: Diagnosis not present

## 2016-11-23 HISTORY — PX: PLANTAR FASCIA RELEASE: SHX2239

## 2016-11-23 HISTORY — DX: Scoliosis, unspecified: M41.9

## 2016-11-23 HISTORY — PX: TARSAL TUNNEL RELEASE: SHX5042

## 2016-11-23 HISTORY — DX: Presence of dental prosthetic device (complete) (partial): Z97.2

## 2016-11-23 SURGERY — RELEASE, TARSAL TUNNEL
Anesthesia: Monitor Anesthesia Care | Site: Foot | Laterality: Left | Wound class: Clean

## 2016-11-23 MED ORDER — MIDAZOLAM HCL 2 MG/2ML IJ SOLN: INTRAMUSCULAR | Status: DC | PRN

## 2016-11-23 MED ORDER — CLINDAMYCIN PHOSPHATE 600 MG/50ML IV SOLN
600.0000 mg | Freq: Once | INTRAVENOUS | Status: AC
  Administered 2016-11-23: 600 mg via INTRAVENOUS

## 2016-11-23 MED ORDER — SODIUM CHLORIDE 0.9 % IV SOLN: 2000.0000 mg | Freq: Once | INTRAVENOUS | Status: DC

## 2016-11-23 MED ORDER — FENTANYL CITRATE (PF) 100 MCG/2ML IJ SOLN
INTRAMUSCULAR | Status: DC | PRN
  Administered 2016-11-23: 50 ug via INTRAVENOUS

## 2016-11-23 MED ORDER — LIDOCAINE HCL (CARDIAC) 20 MG/ML IV SOLN
INTRAVENOUS | Status: DC | PRN
  Administered 2016-11-23: 50 mg via INTRAVENOUS

## 2016-11-23 MED ORDER — LIDOCAINE-EPINEPHRINE 1 %-1:100000 IJ SOLN
INTRAMUSCULAR | Status: DC | PRN
  Administered 2016-11-23: 10 mL

## 2016-11-23 MED ORDER — BIOTIN 5000 MCG PO CAPS: 1.0000 | ORAL_CAPSULE | Freq: Every day | ORAL | Status: AC

## 2016-11-23 MED ORDER — ONDANSETRON HCL 4 MG/2ML IJ SOLN: 4.0000 mg | Freq: Once | INTRAMUSCULAR | Status: DC | PRN

## 2016-11-23 MED ORDER — BUPIVACAINE HCL (PF) 0.25 % IJ SOLN
INTRAMUSCULAR | Status: DC | PRN
  Administered 2016-11-23: 10 mL

## 2016-11-23 MED ORDER — ACETAMINOPHEN 160 MG/5ML PO SOLN: 325.0000 mg | ORAL | Status: DC | PRN

## 2016-11-23 MED ORDER — ONDANSETRON HCL 4 MG PO TABS: 4.0000 mg | ORAL_TABLET | Freq: Four times a day (QID) | ORAL | Status: DC | PRN

## 2016-11-23 MED ORDER — OXYCODONE-ACETAMINOPHEN 5-325 MG PO TABS: 1.0000 | ORAL_TABLET | ORAL | 0 refills | Status: DC | PRN

## 2016-11-23 MED ORDER — FENTANYL CITRATE (PF) 100 MCG/2ML IJ SOLN: 25.0000 ug | INTRAMUSCULAR | Status: DC | PRN

## 2016-11-23 MED ORDER — OXYCODONE-ACETAMINOPHEN 5-325 MG PO TABS: 1.0000 | ORAL_TABLET | ORAL | Status: DC | PRN

## 2016-11-23 MED ORDER — MIDAZOLAM HCL 2 MG/2ML IJ SOLN
INTRAMUSCULAR | Status: DC | PRN
  Administered 2016-11-23: 1 mg via INTRAVENOUS

## 2016-11-23 MED ORDER — ROPIVACAINE HCL 5 MG/ML IJ SOLN
INTRAMUSCULAR | Status: DC | PRN
  Administered 2016-11-23: 30 mL via PERINEURAL

## 2016-11-23 MED ORDER — LACTATED RINGERS IV SOLN
INTRAVENOUS | Status: DC
  Administered 2016-11-23: 14:00:00 via INTRAVENOUS

## 2016-11-23 MED ORDER — DEXAMETHASONE SODIUM PHOSPHATE 4 MG/ML IJ SOLN
INTRAMUSCULAR | Status: DC | PRN
  Administered 2016-11-23: 4 mg via PERINEURAL

## 2016-11-23 MED ORDER — ACETAMINOPHEN 325 MG PO TABS: 325.0000 mg | ORAL_TABLET | ORAL | Status: DC | PRN

## 2016-11-23 MED ORDER — ONDANSETRON HCL 4 MG/2ML IJ SOLN: 4.0000 mg | Freq: Four times a day (QID) | INTRAMUSCULAR | Status: DC | PRN

## 2016-11-23 MED ORDER — PROPOFOL 500 MG/50ML IV EMUL
INTRAVENOUS | Status: DC | PRN
  Administered 2016-11-23: 50 ug/kg/min via INTRAVENOUS

## 2016-11-23 SURGICAL SUPPLY — 55 items
APPLICATOR COTTON TIP 6IN STRL (MISCELLANEOUS) ×6 IMPLANT
BANDAGE ELASTIC 4 LF NS (GAUZE/BANDAGES/DRESSINGS) ×2 IMPLANT
BANDAGE ELASTIC 4 VELCRO NS (GAUZE/BANDAGES/DRESSINGS) ×2 IMPLANT
BENZOIN TINCTURE PRP APPL 2/3 (GAUZE/BANDAGES/DRESSINGS) ×2 IMPLANT
BLADE ENDOTRAC PUSH EPF/EGR (MISCELLANEOUS) IMPLANT
BLADE MED AGGRESSIVE (BLADE) IMPLANT
BLADE OSC/SAGITTAL MD 5.5X18 (BLADE) IMPLANT
BLADE SURG 15 STRL LF DISP TIS (BLADE) IMPLANT
BLADE SURG 15 STRL SS (BLADE)
BLADE TRIANGLE EPF/EGR ENDO (BLADE) ×2 IMPLANT
BNDG ESMARK 4X12 TAN STRL LF (GAUZE/BANDAGES/DRESSINGS) ×2 IMPLANT
BNDG GAUZE 4.5X4.1 6PLY STRL (MISCELLANEOUS) ×2 IMPLANT
BNDG STRETCH 4X75 STRL LF (GAUZE/BANDAGES/DRESSINGS) ×2 IMPLANT
CANISTER SUCT 1200ML W/VALVE (MISCELLANEOUS) ×2 IMPLANT
COVER LIGHT HANDLE UNIVERSAL (MISCELLANEOUS) ×4 IMPLANT
CUFF TOURN SGL QUICK 18 (TOURNIQUET CUFF) ×2 IMPLANT
DRAPE FLUOR MINI C-ARM 54X84 (DRAPES) ×2 IMPLANT
DURAPREP 26ML APPLICATOR (WOUND CARE) ×2 IMPLANT
GAUZE PETRO XEROFOAM 5X9 (MISCELLANEOUS) ×4 IMPLANT
GAUZE SPONGE 4X4 12PLY STRL (GAUZE/BANDAGES/DRESSINGS) ×2 IMPLANT
GLOVE BIO SURGEON STRL SZ7.5 (GLOVE) ×6 IMPLANT
GLOVE INDICATOR 8.0 STRL GRN (GLOVE) ×6 IMPLANT
GOWN STRL REUS W/ TWL LRG LVL3 (GOWN DISPOSABLE) ×3 IMPLANT
GOWN STRL REUS W/TWL LRG LVL3 (GOWN DISPOSABLE) ×3
IV NS 250ML (IV SOLUTION) ×1
IV NS 250ML BAXH (IV SOLUTION) ×1 IMPLANT
K-WIRE DBL END TROCAR 6X.045 (WIRE)
K-WIRE DBL END TROCAR 6X.062 (WIRE) ×2
KIT ROOM TURNOVER OR (KITS) ×2 IMPLANT
KWIRE DBL END TROCAR 6X.045 (WIRE) IMPLANT
KWIRE DBL END TROCAR 6X.062 (WIRE) ×1 IMPLANT
NEEDLE 18GX1X1/2 (RX/OR ONLY) (NEEDLE) ×2 IMPLANT
NEEDLE HYPO 25GX1X1/2 BEV (NEEDLE) ×2 IMPLANT
NS IRRIG 500ML POUR BTL (IV SOLUTION) ×2 IMPLANT
PACK EXTREMITY ARMC (MISCELLANEOUS) ×2 IMPLANT
PAD GROUND ADULT SPLIT (MISCELLANEOUS) ×2 IMPLANT
RASP SM TEAR CROSS CUT (RASP) IMPLANT
SOL ANTI-FOG 6CC FOG-OUT (MISCELLANEOUS) IMPLANT
SOL FOG-OUT ANTI-FOG 6CC (MISCELLANEOUS)
STOCKINETTE STRL 6IN 960660 (GAUZE/BANDAGES/DRESSINGS) ×2 IMPLANT
STRAP BODY AND KNEE 60X3 (MISCELLANEOUS) ×2 IMPLANT
STRIP CLOSURE SKIN 1/4X4 (GAUZE/BANDAGES/DRESSINGS) ×2 IMPLANT
SUT ETHILON 4-0 (SUTURE) ×1
SUT ETHILON 4-0 FS2 18XMFL BLK (SUTURE) ×1
SUT MNCRL 5-0+ PC-1 (SUTURE) ×1 IMPLANT
SUT MONOCRYL 5-0 (SUTURE) ×1
SUT VIC AB 2-0 SH 27 (SUTURE)
SUT VIC AB 2-0 SH 27XBRD (SUTURE) IMPLANT
SUT VIC AB 3-0 SH 27 (SUTURE)
SUT VIC AB 3-0 SH 27X BRD (SUTURE) IMPLANT
SUT VIC AB 4-0 FS2 27 (SUTURE) ×2 IMPLANT
SUTURE ETHLN 4-0 FS2 18XMF BLK (SUTURE) ×1 IMPLANT
SYRINGE 10CC LL (SYRINGE) ×2 IMPLANT
WAND TENDON TOPAZ 0 ANGL (MISCELLANEOUS) IMPLANT
WAND TOPAZ MICRO DEBRIDER (MISCELLANEOUS) ×2 IMPLANT

## 2016-11-23 NOTE — Anesthesia Procedure Notes (Signed)
Procedure Name: MAC Performed by: Ruddy Swire Pre-anesthesia Checklist: Patient identified, Emergency Drugs available, Suction available, Timeout performed and Patient being monitored Patient Re-evaluated:Patient Re-evaluated prior to inductionOxygen Delivery Method: Nasal cannula Placement Confirmation: positive ETCO2     

## 2016-11-23 NOTE — Anesthesia Preprocedure Evaluation (Signed)
Anesthesia Evaluation  Patient identified by MRN, date of birth, ID band Patient awake    Reviewed: Allergy & Precautions, H&P , NPO status , Patient's Chart, lab work & pertinent test results  Airway Mallampati: II  TM Distance: >3 FB Neck ROM: full    Dental no notable dental hx.    Pulmonary COPD, former smoker,    Pulmonary exam normal        Cardiovascular hypertension, Normal cardiovascular exam     Neuro/Psych PSYCHIATRIC DISORDERS    GI/Hepatic GERD  ,  Endo/Other    Renal/GU      Musculoskeletal   Abdominal   Peds  Hematology   Anesthesia Other Findings   Reproductive/Obstetrics                             Anesthesia Physical Anesthesia Plan  ASA: III  Anesthesia Plan: MAC and Regional   Post-op Pain Management:    Induction:   Airway Management Planned:   Additional Equipment:   Intra-op Plan:   Post-operative Plan:   Informed Consent: I have reviewed the patients History and Physical, chart, labs and discussed the procedure including the risks, benefits and alternatives for the proposed anesthesia with the patient or authorized representative who has indicated his/her understanding and acceptance.     Plan Discussed with:   Anesthesia Plan Comments:         Anesthesia Quick Evaluation

## 2016-11-23 NOTE — Anesthesia Postprocedure Evaluation (Signed)
Anesthesia Post Note  Patient: Tina Frey  Procedure(s) Performed: Procedure(s) (LRB): TARSAL TUNNEL RELEASE (Left) ENDOSCOPIC PLANTAR FASCIA RELEASE (Left)  Patient location during evaluation: PACU Anesthesia Type: Regional Level of consciousness: awake and alert and oriented Pain management: satisfactory to patient Vital Signs Assessment: post-procedure vital signs reviewed and stable Respiratory status: spontaneous breathing, nonlabored ventilation and respiratory function stable Cardiovascular status: blood pressure returned to baseline and stable Postop Assessment: Adequate PO intake and No signs of nausea or vomiting Anesthetic complications: no    Raliegh Ip

## 2016-11-23 NOTE — Progress Notes (Signed)
Assisted Clance Boll ANMD with left, ultrasound guided, popliteal block. Side rails up, monitors on throughout procedure. See vital signs in flow sheet. Tolerated Procedure well.

## 2016-11-23 NOTE — Anesthesia Procedure Notes (Signed)
Anesthesia Regional Block:  Popliteal block  Pre-Anesthetic Checklist: ,, timeout performed, Correct Patient, Correct Site, Correct Laterality, Correct Procedure, Correct Position, site marked, Risks and benefits discussed, Surgical consent,  Pre-op evaluation,  At surgeon's request  Laterality: Left  Prep: chloraprep       Needles:  Injection technique: Single-shot  Needle Type: Stimiplex     Needle Length: 9cm 9 cm Needle Gauge: 21 and 21 G    Additional Needles:  Procedures: ultrasound guided (picture in chart) Popliteal block Narrative:  Start time: 11/23/2016 2:14 PM End time: 11/23/2016 2:21 PM Injection made incrementally with aspirations every 5 mL.  Performed by: Personally  Anesthesiologist: Ronelle Nigh  Additional Notes: Functioning IV was confirmed and monitors applied. Ultrasound guidance: relevant anatomy identified, needle position confirmed, local anesthetic spread visualized around nerve(s)., vascular puncture avoided.  Image printed for medical record.  Negative aspiration and no paresthesias; incremental administration of local anesthetic. The patient tolerated the procedure well. Vitals signes recorded in RN notes. 20 ml to popliteal, and 10 ml to adductor canal.

## 2016-11-23 NOTE — H&P (Signed)
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  The various methods of treatment have been discussed with the patient.  No guarantees were given.  After consideration of risks, benefits and other options for treatment, the patient has consented to surgery.  I have reviewed the patients' chart and labs.    Patient Vitals for the past 24 hrs:  BP Temp Temp src Pulse Resp SpO2 Height Weight  11/23/16 1450 (!) 152/75 - - 73 15 100 % - -  11/23/16 1440 (!) 151/74 - - 86 17 99 % - -  11/23/16 1430 (!) 152/79 - - 79 18 98 % - -  11/23/16 1425 139/74 - - 76 17 98 % - -  11/23/16 1420 (!) 157/75 - - 81 16 96 % - -  11/23/16 1414 (!) 146/77 - - 84 15 97 % - -  11/23/16 1339 (!) 164/73 97.7 F (36.5 C) Temporal 80 16 97 % 5\' 1"  (1.549 m) 67.1 kg (148 lb)    A history and physical examination was performed in my office.  The patient was reexamined.  There have been no changes to this history and physical examination.  [name] A

## 2016-11-23 NOTE — Op Note (Signed)
Operative note   Surgeon:Scarlettrose Costilow Lawyer: None    Preop diagnosis: 1. Left ankle tarsal tunnel syndrome 2. Left heel plantar fasciitis    Postop diagnosis: Same    Procedure: 1. Left open tarsal tunnel release 2. Endoscopic left heel plantar fasciotomy    EBL: Minimal    Anesthesia:regional and IV sedation    Hemostasis: [place]f tourniquet inflated to 200 mmHg for approximately 15 minutes    Specimen: None    Complications: None    Operative indications:Tina Frey is an 75 y.o. that presents today for surgical intervention.  The risks/benefits/alternatives/complications have been discussed and consent has been given.    Procedure:  Patient was brought into the OR and placed on the operating table in thesupine position. After anesthesia was obtained theleft lower extremity was prepped and draped in usual sterile fashion.  The incision site was initially infiltrated with 9 cc of 1% lidocaine with epinephrine 1-100,000. After sterile prep and drape attention was directed to the medial aspect of the left ankle where a medial longitudinal incision was made overlying the tarsal tunnel region. Sharp and blunt dissection was taken down to the medial retinacular laciniate ligament. This was incised. This exposed the underlying posterior tibial artery and associated veins. These were mapped out and dissected away appropriately. Care was taken to retract these throughout the entire procedure. Further dissection then revealed the tibial nerve. Dissection was taken proximal distal. Dissection of the medial calcaneal nerve branch was undertaken down to the medial aspect of the calcaneus. The medial and lateral plantar ranches of the tibial nerve were also dissected into the abductor canal and the adductor canal was released of any adhesions and ligamentous attachments. Good release was noted throughout. The wound was then flushed with copious amounts of irrigation. Subcutaneous tissue was  closed with a 4-0 Vicryl and the skin closed with a 4-0 Monocryl.  Attention was then directed to the plantar medial heel where a small incision was made at the plantar fascial insertion medially. Blunt dissected down to the plantar fascia. The fascial elevator was then introduced. The blunt trocar and cannula was introduced from medial to lateral. The tenting of skin was noted laterally and a small incision was made. This delivered the blunt trocar and cannula to the lateral aspect of the foot. The trocar was removed. The endoscope was then placed into the canal and the medial aspect of the plantar fascial was noted. Next with a diamond blade the medial one half of the plantar fascia was incised. Exposure of the underlying muscle belly was noted. The wound was flushed with copious amount of irrigation. The blunt trocar was entered and the trocar and cannula was removed. Closure of the skin was performed with a 4-0 nylon.  Finally small percutaneous holes were placed in the plantar aspect of the heel at the plantar fascial insertion and the Topaz wand was introduced. A proximal release 6 cutaneous passes were placed. All areas were then infiltrated with 0.25% lain Marcaine. A bulky sterile dressing was applied to the left heel. There is strips had been applied to the medial ankle incision site. Patient was then placed in the equalizer walker boot.    Patient tolerated the procedure and anesthesia well.  Was transported from the OR to the PACU with all vital signs stable and vascular status intact. To be discharged per routine protocol.  Will follow up in approximately 1 week in the outpatient clinic.

## 2016-11-23 NOTE — Transfer of Care (Signed)
Immediate Anesthesia Transfer of Care Note  Patient: Tina Frey  Procedure(s) Performed: Procedure(s) with comments: TARSAL TUNNEL RELEASE (Left) - POPLITEAL ENDOSCOPIC PLANTAR FASCIA RELEASE (Left)  Patient Location: PACU  Anesthesia Type: MAC, Regional  Level of Consciousness: awake, alert  and patient cooperative  Airway and Oxygen Therapy: Patient Spontanous Breathing and Patient connected to supplemental oxygen  Post-op Assessment: Post-op Vital signs reviewed, Patient's Cardiovascular Status Stable, Respiratory Function Stable, Patent Airway and No signs of Nausea or vomiting  Post-op Vital Signs: Reviewed and stable  Complications: No apparent anesthesia complications

## 2016-11-24 ENCOUNTER — Encounter: Payer: Self-pay | Admitting: Podiatry

## 2017-05-29 ENCOUNTER — Other Ambulatory Visit: Payer: Self-pay

## 2017-05-31 ENCOUNTER — Other Ambulatory Visit: Payer: Self-pay | Admitting: Internal Medicine

## 2017-05-31 DIAGNOSIS — Z1231 Encounter for screening mammogram for malignant neoplasm of breast: Secondary | ICD-10-CM

## 2017-06-13 ENCOUNTER — Ambulatory Visit
Admission: RE | Admit: 2017-06-13 | Discharge: 2017-06-13 | Disposition: A | Discharge status: Home / Self Care | Payer: Medicare Other | Source: Ambulatory Visit | Attending: Internal Medicine | Admitting: Internal Medicine

## 2017-06-13 DIAGNOSIS — Z1231 Encounter for screening mammogram for malignant neoplasm of breast: Secondary | ICD-10-CM | POA: Insufficient documentation

## 2017-06-19 ENCOUNTER — Ambulatory Visit: Payer: Medicare Other | Admitting: Urology

## 2017-06-19 ENCOUNTER — Encounter: Payer: Self-pay | Admitting: Urology

## 2017-06-19 VITALS — BP 144/73 | HR 67 | Ht 61.0 in | Wt 147.4 lb

## 2017-06-19 DIAGNOSIS — R32 Unspecified urinary incontinence: Secondary | ICD-10-CM

## 2017-06-19 DIAGNOSIS — N3946 Mixed incontinence: Secondary | ICD-10-CM

## 2017-06-19 LAB — MICROSCOPIC EXAMINATION
RBC, UA: NONE SEEN /hpf (ref 0–?)
WBC, UA: >30 /hpf — ABNORMAL HIGH (ref 0–?)

## 2017-06-19 LAB — URINALYSIS, COMPLETE
BILIRUBIN UA: NEGATIVE
GLUCOSE, UA: NEGATIVE
KETONES UA: NEGATIVE
Leukocytes, UA: NEGATIVE
NITRITE UA: NEGATIVE
SPEC GRAV UA: 1.025 (ref 1.005–1.030)
UUROB: 1 mg/dL (ref 0.2–1.0)
pH, UA: 6 (ref 5.0–7.5)

## 2017-06-19 LAB — BLADDER SCAN AMB NON-IMAGING: SCAN RESULT: 40

## 2017-06-19 NOTE — Progress Notes (Signed)
06/19/2017 10:03 AM   Tina Frey May 25, 1941 [id number]  Referring provider: Glendon Axe, [facility] [address], [place] [address]  Chief Complaint  Patient presents with  . Urinary Incontinence    HPI: I was consulted to assist the patient's urinary incontinence worsening over the last 1 or 2 years. She leaks with coughing sneezing bending and lifting and with urgency and both are significant. She has mild bedwetting. She wears 4 pads per day moderately wet and sometimes soaked.   She voids every 2 hours and gets up 3 or 4 times to urinate. By history she failed oxybutynin and one other medication.  She has had a hysterectomy. She passed one kidney stone. She has not had bladder surgery. She has no neurologic issues. She has not been treated with Guy Begin or the beta 3 agonist  Modifying factors: There are no other modifying factors  Associated signs and symptoms: There are no other associated signs and symptoms Aggravating and relieving factors: There are no other aggravating or relieving factors Severity: Moderate Duration: Persistent   PMH: Past Medical History:  Diagnosis Date  . Allergy   . Arthritis   . Arthritis   . Cataract   . COPD (chronic obstructive pulmonary disease) ([place])   . GERD (gastroesophageal reflux disease)   . Hyperlipidemia   . Hypertension   . Mitral valve prolapse   . Scoliosis   . Wears dentures     Surgical History: Past Surgical History:  Procedure Laterality Date  . ABDOMINAL HYSTERECTOMY    . APPENDECTOMY    . ARTHROSCOPY KNEE W/ DRILLING    . biopsy lip    . BREAST EXCISIONAL BIOPSY Right 1982   NEG  . BREAST SURGERY    . BUNIONECTOMY     right  . CARDIAC CATHETERIZATION    . CARDIOVASCULAR STRESS TEST    . CARPAL TUNNEL RELEASE     left  . CARPAL TUNNEL RELEASE    . CATARACT EXTRACTION     left  . CHOLECYSTECTOMY    . cmc joint repair     left  . COLONOSCOPY WITH PROPOFOL N/A  07/19/2016   Procedure: COLONOSCOPY WITH PROPOFOL;  Surgeon: Lollie Sails, MD;  [facility] END[facility]COPY;  Service: Endoscopy;  Laterality: N/A;  . EYE SURGERY    . HERNIA REPAIR    . HERNIA REPAIR    . JOINT REPLACEMENT     knee replacement  . PLANTAR FASCIA RELEASE Left 11/23/2016   Procedure: ENDOSCOPIC PLANTAR FASCIA RELEASE;  Surgeon: [name], DPM;  Location: [facility];  Service: Podiatry;  Laterality: Left;  . REPLACEMENT TOTAL KNEE     left  . right shoulder surgery    . TARSAL TUNNEL RELEASE Left 11/23/2016   Procedure: TARSAL TUNNEL RELEASE;  Surgeon: [name], DPM;  Location: [facility];  Service: Podiatry;  Laterality: Left;  POPLITEAL    Home Medications:  Allergies as of 06/19/2017      Reactions   Cephalexin Nausea Only   Ciprofloxacin Nausea And Vomiting   Morphine And Related Nausea And Vomiting   Sulfa Antibiotics Other (See Comments)   Causes yeast infections      Medication List       Accurate as of 06/19/17 10:03 AM. Always use your most recent med list.          ALBUTEROL IN Inhale into the lungs.   alendronate 70 MG tablet Commonly known as:  FOSAMAX Take 70 mg by mouth once a week. Take with a full glass of water on an empty stomach.   aspirin 81 MG chewable tablet Chew 81 mg by mouth daily.   atorvastatin 20 MG tablet Commonly known as:  LIPITOR Take 20 mg by mouth daily.   Biotin 5000 MCG Caps Take 1 capsule (5,000 mcg total) by mouth daily.   calcium-vitamin D 500-200 MG-UNIT tablet Commonly known as:  OSCAL WITH D Take 1 tablet by mouth daily.   fentaNYL 25 MCG/HR patch Commonly known as:  DURAGESIC - dosed mcg/hr Place 1 patch (25 mcg total) onto the skin every 3 (three) days.   fentaNYL 25 MCG/HR patch Commonly known as:  DURAGESIC - dosed mcg/hr Place 1 patch (25 mcg total) onto the skin every 3 (three) days.   fentaNYL 25 MCG/HR patch Commonly known as:  DURAGESIC - dosed mcg/hr Place 1 patch  (25 mcg total) onto the skin every 3 (three) days.   fluticasone 50 MCG/ACT nasal spray Commonly known as:  FLONASE Place 2 sprays into both nostrils daily.   lisinopril 10 MG tablet Commonly known as:  PRINIVIL,ZESTRIL Take 10 mg by mouth daily.   loratadine 10 MG tablet Commonly known as:  CLARITIN Take 10 mg by mouth daily.   metoprolol succinate 25 MG 24 hr tablet Commonly known as:  TOPROL-XL Take 25 mg by mouth 2 (two) times daily.   naproxen 500 MG tablet Commonly known as:  NAPROSYN Take 1 tablet (500 mg total) by mouth 2 (two) times daily with a meal.   oxybutynin 10 MG 24 hr tablet Commonly known as:  DITROPAN-XL Take 10 mg by mouth at bedtime.   oxyCODONE-acetaminophen 5-325 MG tablet Commonly known as:  ROXICET Take 1-2 tablets by mouth every 4 (four) hours as needed for severe pain.   pantoprazole 40 MG tablet Commonly known as:  PROTONIX Take 40 mg by mouth daily.   PRESERVISION AREDS PO Take 2 capsules by mouth daily.   sertraline 50 MG tablet Commonly known as:  ZOLOFT Take 50 mg by mouth daily.   tiotropium 18 MCG inhalation capsule Commonly known as:  SPIRIVA Place 18 mcg into inhaler and inhale daily.   Vitamin D (Ergocalciferol) 50000 units Caps capsule Commonly known as:  DRISDOL Take 1 capsule (50,000 Units total) by mouth 2 (two) times a week. X 6 weeks.   Vitamin D3 2000 units capsule Take 1 capsule (2,000 Units total) by mouth daily.       Allergies:  Allergies  Allergen Reactions  . Cephalexin Nausea Only  . Ciprofloxacin Nausea And Vomiting  . Morphine And Related Nausea And Vomiting  . Sulfa Antibiotics Other (See Comments)    Causes yeast infections     Family History: Family History  Problem Relation Age of Onset  . Cancer Mother   . Heart disease Father   . Heart disease Brother   . Breast cancer Neg Hx   . Bladder Cancer Neg Hx   . Kidney cancer Neg Hx     Social History:  reports that she quit smoking about  13 years ago. She has never used smokeless tobacco. She reports that she does not drink alcohol or use drugs.  ROS: UROLOGY Frequent Urination?: Yes Hard to postpone urination?: Yes Burning/pain with urination?: No Get up at night to urinate?: Yes Leakage of urine?: Yes Urine stream starts and stops?: Yes Trouble starting stream?: No Do you have to strain to urinate?: No Blood in  urine?: No Urinary tract infection?: No Sexually transmitted disease?: No Injury to kidneys or bladder?: No Painful intercourse?: No Weak stream?: No Currently pregnant?: No Vaginal bleeding?: No Last menstrual period?: n  Gastrointestinal Nausea?: No Vomiting?: No Indigestion/heartburn?: Yes Diarrhea?: No Constipation?: No  Constitutional Fever: No Night sweats?: No Weight loss?: No Fatigue?: No  Skin Skin rash/lesions?: No Itching?: No  Eyes Blurred vision?: No Double vision?: No  Ears/Nose/Throat Sore throat?: No Sinus problems?: Yes  Hematologic/Lymphatic Swollen glands?: No Easy bruising?: Yes  Cardiovascular Leg swelling?: No Chest pain?: No  Respiratory Cough?: Yes Shortness of breath?: No  Endocrine Excessive thirst?: No  Musculoskeletal Back pain?: Yes Joint pain?: Yes  Neurological Headaches?: No Dizziness?: No  Psychologic Depression?: Yes Anxiety?: Yes  Physical Exam: BP (!) 144/73 (BP Location: Left Arm, Patient Position: Sitting, Cuff Size: Normal)   Pulse 67   Ht 5\' 1"  (1.549 m)   Wt 147 lb 6.4 oz (66.9 kg)   BMI 27.85 kg/m   Constitutional:  Alert and oriented, No acute distress. HEENT: [place] AT, moist mucus membranes.  Trachea midline, no masses. Cardiovascular: No clubbing, cyanosis, or edema. Respiratory: Normal respiratory effort, no increased work of breathing. GI: Abdomen is soft, nontender, nondistended, no abdominal masses GU:  Grade 2 hypermobility of bladder neck and a mild positive cough test; moderate vaginal atrophy Skin: No  rashes, bruises or suspicious lesions. Lymph: No cervical or inguinal adenopathy. Neurologic: Grossly intact, no focal deficits, moving all 4 extremities. Psychiatric: Normal mood and affect.  Laboratory Data: No results found for: WBC, HGB, HCT, MCV, PLT  Lab Results  Component Value Date   CREATININE 0.70 09/14/2016    No results found for: PSA  No results found for: TESTOSTERONE  No results found for: HGBA1C  Urinalysis No results found for: COLORURINE, APPEARANCEUR, LABSPEC, PHURINE, GLUCOSEU, HGBUR, BILIRUBINUR, KETONESUR, PROTEINUR, UROBILINOGEN, NITRITE, LEUKOCYTESUR  Pertinent Imaging: none  Assessment & Plan:  The patient has mixed incontinence and mild bedwetting. The role of urodynamics was discussed.  1. Urinary incontinence, unspecified type 2. Mixed incontinence 3. Nighttime frequency - Urinalysis, Complete - Bladder Scan (Post Void Residual) in office   No Follow-up on file.  Reece Packer, MD  [facility] [address], [place], [place] [address] [phone]

## 2017-07-10 ENCOUNTER — Ambulatory Visit: Payer: Medicare Other

## 2017-07-17 ENCOUNTER — Other Ambulatory Visit: Payer: Self-pay | Admitting: Urology

## 2017-07-24 ENCOUNTER — Ambulatory Visit (INDEPENDENT_AMBULATORY_CARE_PROVIDER_SITE_OTHER): Payer: Medicare Other | Admitting: Urology

## 2017-07-24 ENCOUNTER — Encounter: Payer: Self-pay | Admitting: Urology

## 2017-07-24 VITALS — BP 120/63 | HR 76 | Ht 61.0 in | Wt 146.1 lb

## 2017-07-24 DIAGNOSIS — N3946 Mixed incontinence: Secondary | ICD-10-CM

## 2017-07-24 MED ORDER — MIRABEGRON ER 50 MG PO TB24: 50.0000 mg | ORAL_TABLET | Freq: Every day | ORAL | 11 refills | Status: DC

## 2017-07-24 NOTE — Progress Notes (Signed)
07/24/2017 3:54 PM   Tina Frey Aug 15, 1941 [id number]  Referring provider: Glendon Axe, MD [place] [facility] [place], [place] [address]  Chief Complaint  Patient presents with  . Follow-up    UDS results    HPI: I was consulted to assist the patient's urinary incontinence worsening over the last 1 or 2 years. She leaks with coughing sneezing bending and lifting and with urgency and both are significant. She has mild bedwetting. She wears 4 pads per day moderately wet and sometimes soaked.   She voids every 2 hours and gets up 3 or 4 times to urinate. By history she failed oxybutynin and one other medication.  She has had a hysterectomy. She passed one kidney stone. She has not had bladder surgery. She has no neurologic issues. She has not been treated with Guy Begin or the beta 3 agonist  Grade 2 hypermobility of bladder neck and a mild positive cough test; moderate vaginal atrophy  Today On urodynamics the patient's bladder capacity was 244 mL. Bladder was unstable reaching pressures of 3 cm of water. She did not leak but almost did. She had sensory urgency. She had mild to moderate leakage at 100 mL at 63 cm of water. She had the same at 58 cm water 200 mL. During voluntary voiding she voided 220 mL with a max of all 10 nails per sec and a maximal voiding pressure 11 cm water an empty efficiently. EMG activity was normal. Bladder neck descended 1 or 2 cm.  Incontinence and frequency are stable      PMH: Past Medical History:  Diagnosis Date  . Allergy   . Arthritis   . Arthritis   . Cataract   . COPD (chronic obstructive pulmonary disease) ([place])   . GERD (gastroesophageal reflux disease)   . Hyperlipidemia   . Hypertension   . Mitral valve prolapse   . Scoliosis   . Wears dentures     Surgical History: Past Surgical History:  Procedure Laterality Date  . ABDOMINAL HYSTERECTOMY    . APPENDECTOMY    . ARTHROSCOPY KNEE W/  DRILLING    . biopsy lip    . BREAST EXCISIONAL BIOPSY Right 1982   NEG  . BREAST SURGERY    . BUNIONECTOMY     right  . CARDIAC CATHETERIZATION    . CARDIOVASCULAR STRESS TEST    . CARPAL TUNNEL RELEASE     left  . CARPAL TUNNEL RELEASE    . CATARACT EXTRACTION     left  . CHOLECYSTECTOMY    . cmc joint repair     left  . COLONOSCOPY WITH PROPOFOL N/A 07/19/2016   Procedure: COLONOSCOPY WITH PROPOFOL;  Surgeon: Lollie Sails, MD;  Location: [facility] ENDOSCOPY;  Service: Endoscopy;  Laterality: N/A;  . EYE SURGERY    . HERNIA REPAIR    . HERNIA REPAIR    . JOINT REPLACEMENT     knee replacement  . PLANTAR FASCIA RELEASE Left 11/23/2016   Procedure: ENDOSCOPIC PLANTAR FASCIA RELEASE;  Surgeon: [name], DPM;  Location: [place];  Service: Podiatry;  Laterality: Left;  . REPLACEMENT TOTAL KNEE     left  . right shoulder surgery    . TARSAL TUNNEL RELEASE Left 11/23/2016   Procedure: TARSAL TUNNEL RELEASE;  Surgeon: [name], DPM;  Location: [place];  Service: Podiatry;  Laterality: Left;  POPLITEAL    Home Medications:  Allergies as of 07/24/2017  Reactions   Cephalexin Nausea Only   Ciprofloxacin Nausea And Vomiting   Morphine And Related Nausea And Vomiting   Sulfa Antibiotics Other (See Comments)   Causes yeast infections      Medication List       Accurate as of 07/24/17  3:54 PM. Always use your most recent med list.          ALBUTEROL IN Inhale into the lungs.   alendronate 70 MG tablet Commonly known as:  FOSAMAX Take 70 mg by mouth once a week. Take with a full glass of water on an empty stomach.   aspirin 81 MG chewable tablet Chew 81 mg by mouth daily.   atorvastatin 20 MG tablet Commonly known as:  LIPITOR Take 20 mg by mouth daily.   Biotin 5000 MCG Caps Take 1 capsule (5,000 mcg total) by mouth daily.   calcium-vitamin D 500-200 MG-UNIT tablet Commonly known as:  OSCAL WITH D Take 1 tablet by mouth daily.     fentaNYL 25 MCG/HR patch Commonly known as:  DURAGESIC - dosed mcg/hr Place 1 patch (25 mcg total) onto the skin every 3 (three) days.   fentaNYL 25 MCG/HR patch Commonly known as:  DURAGESIC - dosed mcg/hr Place 1 patch (25 mcg total) onto the skin every 3 (three) days.   fentaNYL 25 MCG/HR patch Commonly known as:  DURAGESIC - dosed mcg/hr Place 1 patch (25 mcg total) onto the skin every 3 (three) days.   fluticasone 50 MCG/ACT nasal spray Commonly known as:  FLONASE Place 2 sprays into both nostrils daily.   lisinopril 10 MG tablet Commonly known as:  PRINIVIL,ZESTRIL Take 10 mg by mouth daily.   loratadine 10 MG tablet Commonly known as:  CLARITIN Take 10 mg by mouth daily.   metoprolol succinate 25 MG 24 hr tablet Commonly known as:  TOPROL-XL Take 25 mg by mouth 2 (two) times daily.   naproxen 500 MG tablet Commonly known as:  NAPROSYN Take 1 tablet (500 mg total) by mouth 2 (two) times daily with a meal.   oxybutynin 10 MG 24 hr tablet Commonly known as:  DITROPAN-XL Take 10 mg by mouth at bedtime.   oxyCODONE-acetaminophen 5-325 MG tablet Commonly known as:  ROXICET Take 1-2 tablets by mouth every 4 (four) hours as needed for severe pain.   pantoprazole 40 MG tablet Commonly known as:  PROTONIX Take 40 mg by mouth daily.   PRESERVISION AREDS PO Take 2 capsules by mouth daily.   sertraline 50 MG tablet Commonly known as:  ZOLOFT Take 50 mg by mouth daily.   tiotropium 18 MCG inhalation capsule Commonly known as:  SPIRIVA Place 18 mcg into inhaler and inhale daily.   Vitamin D (Ergocalciferol) 50000 units Caps capsule Commonly known as:  DRISDOL Take 1 capsule (50,000 Units total) by mouth 2 (two) times a week. X 6 weeks.   Vitamin D3 2000 units capsule Take 1 capsule (2,000 Units total) by mouth daily.       Allergies:  Allergies  Allergen Reactions  . Cephalexin Nausea Only  . Ciprofloxacin Nausea And Vomiting  . Morphine And Related  Nausea And Vomiting  . Sulfa Antibiotics Other (See Comments)    Causes yeast infections     Family History: Family History  Problem Relation Age of Onset  . Cancer Mother   . Heart disease Father   . Heart disease Brother   . Breast cancer Neg Hx   . Bladder Cancer Neg Hx   .  Kidney cancer Neg Hx     Social History:  reports that she quit smoking about 13 years ago. She has never used smokeless tobacco. She reports that she does not drink alcohol or use drugs.  ROS: UROLOGY Frequent Urination?: Yes Hard to postpone urination?: Yes Burning/pain with urination?: No Get up at night to urinate?: Yes Leakage of urine?: Yes Urine stream starts and stops?: No Trouble starting stream?: No Do you have to strain to urinate?: No Blood in urine?: No Urinary tract infection?: No Sexually transmitted disease?: No Injury to kidneys or bladder?: No Painful intercourse?: No Weak stream?: No Currently pregnant?: No Vaginal bleeding?: No Last menstrual period?: n  Gastrointestinal Nausea?: No Vomiting?: No Indigestion/heartburn?: No Diarrhea?: No Constipation?: No  Constitutional Fever: No Night sweats?: No Weight loss?: No Fatigue?: No  Skin Skin rash/lesions?: No Itching?: No  Eyes Blurred vision?: No Double vision?: No  Ears/Nose/Throat Sore throat?: No Sinus problems?: No  Hematologic/Lymphatic Swollen glands?: No Easy bruising?: No  Cardiovascular Leg swelling?: No Chest pain?: No  Respiratory Cough?: No Shortness of breath?: No  Endocrine Excessive thirst?: No  Musculoskeletal Back pain?: Yes Joint pain?: Yes  Neurological Headaches?: No Dizziness?: No  Psychologic Depression?: No Anxiety?: No  Physical Exam:  Laboratory Data: No results found for: WBC, HGB, HCT, MCV, PLT  Lab Results  Component Value Date   CREATININE 0.70 09/14/2016    No results found for: PSA  No results found for: TESTOSTERONE  No results found for:  HGBA1C  Urinalysis    Component Value Date/Time   APPEARANCEUR Clear 06/19/2017 0956   GLUCOSEU Negative 06/19/2017 0956   BILIRUBINUR Negative 06/19/2017 0956   PROTEINUR 3+ (A) 06/19/2017 0956   NITRITE Negative 06/19/2017 0956   LEUKOCYTESUR Negative 06/19/2017 0956    Pertinent Imaging: none  Assessment & Plan:  The patient has mixed incontinence. If the patient does not reach the patient's treatment goal with medical and behavioral therapy a sling would be very reasonable to consider for her moderate  Reassess patient in 4-6 weeks on the beta 3 -50 mg samples and prescription  There are no diagnoses linked to this encounter.  No Follow-up on file.  Reece Packer, MD  [facility] [address], [place], [place] [address] [phone]

## 2017-08-30 ENCOUNTER — Ambulatory Visit (INDEPENDENT_AMBULATORY_CARE_PROVIDER_SITE_OTHER): Payer: Medicare Other | Admitting: Urology

## 2017-08-30 ENCOUNTER — Encounter: Payer: Self-pay | Admitting: Urology

## 2017-08-30 VITALS — BP 115/64 | HR 65 | Ht 61.0 in | Wt 146.9 lb

## 2017-08-30 DIAGNOSIS — N3946 Mixed incontinence: Secondary | ICD-10-CM

## 2017-08-30 MED ORDER — SOLIFENACIN SUCCINATE 5 MG PO TABS: 5.0000 mg | ORAL_TABLET | Freq: Every day | ORAL | 11 refills | Status: DC

## 2017-08-30 NOTE — Progress Notes (Signed)
08/30/2017 9:23 AM   Tina Frey 09/26/1941 [id number]  Referring provider: Glendon Axe, [place] [facility] [place], [place] [address]  Chief Complaint  Patient presents with  . Urinary Incontinence    HPI: I was consulted to assist the patient's urinary incontinence worsening over the last 1 or 2 years. She leaks with coughing sneezing bending and lifting and with urgency and both are significant. She has mild bedwetting. She wears 4 pads per day moderately wet and sometimes soaked.   She voids every 2 hours and gets up 3 or 4 times to urinate. By history she failed oxybutynin and one other medication.She has not been treated with Guy Begin or the beta 3 agonist  Grade 2 hypermobility of bladder neck and a mild positive cough test; moderate vaginal atrophy  On urodynamics the patient's bladder capacity was 244 mL. Bladder was unstable reaching pressures of 3 cm of water. She did not leak but almost did. She had sensory urgency. She had mild to moderate leakage at 100 mL at 63 cm of water. She had the same at 58 cm water 200 mL. During voluntary voiding she voided 220 mL with a max of all 10 nails per sec and a maximal voiding pressure 11 cm water an empty efficiently. EMG activity was normal. Bladder neck descended 1 or 2 cm.  The patient has mixed incontinence. If the patient does not reach the patient's treatment goal with medical and behavioral therapy a sling would be very reasonable to consider for her moderate  Today The patient did not respond to the beta 3 agonist. Frequency stable. Clinically no infections.   PMH: Past Medical History:  Diagnosis Date  . Allergy   . Arthritis   . Arthritis   . Cataract   . COPD (chronic obstructive pulmonary disease) ([place])   . GERD (gastroesophageal reflux disease)   . Hyperlipidemia   . Hypertension   . Mitral valve prolapse   . Scoliosis   . Wears dentures     Surgical History: Past  Surgical History:  Procedure Laterality Date  . ABDOMINAL HYSTERECTOMY    . APPENDECTOMY    . ARTHROSCOPY KNEE W/ DRILLING    . biopsy lip    . BREAST EXCISIONAL BIOPSY Right 1982   NEG  . BREAST SURGERY    . BUNIONECTOMY     right  . CARDIAC CATHETERIZATION    . CARDIOVASCULAR STRESS TEST    . CARPAL TUNNEL RELEASE     left  . CARPAL TUNNEL RELEASE    . CATARACT EXTRACTION     left  . CHOLECYSTECTOMY    . cmc joint repair     left  . COLONOSCOPY WITH PROPOFOL N/A 07/19/2016   Procedure: COLONOSCOPY WITH PROPOFOL;  Surgeon: Lollie Sails, MD;  [facility] ENDOSCOPY;  Service: Endoscopy;  Laterality: N/A;  . EYE SURGERY    . HERNIA REPAIR    . HERNIA REPAIR    . JOINT REPLACEMENT     knee replacement  . PLANTAR FASCIA RELEASE Left 11/23/2016   Procedure: ENDOSCOPIC PLANTAR FASCIA RELEASE;  Surgeon: [name], DPM;  Location: [place];  Service: Podiatry;  Laterality: Left;  . REPLACEMENT TOTAL KNEE     left  . right shoulder surgery    . TARSAL TUNNEL RELEASE Left 11/23/2016   Procedure: TARSAL TUNNEL RELEASE;  Surgeon: [name], DPM;  Location: [place];  Service: Podiatry;  Laterality: Left;  POPLITEAL  Home Medications:  Allergies as of 08/30/2017      Reactions   Cephalexin Nausea Only   Ciprofloxacin Nausea And Vomiting   Morphine And Related Nausea And Vomiting   Sulfa Antibiotics Other (See Comments)   Causes yeast infections      Medication List       Accurate as of 08/30/17  9:23 AM. Always use your most recent med list.          ALBUTEROL IN Inhale into the lungs.   alendronate 70 MG tablet Commonly known as:  FOSAMAX Take 70 mg by mouth once a week. Take with a full glass of water on an empty stomach.   aspirin 81 MG chewable tablet Chew 81 mg by mouth daily.   atorvastatin 20 MG tablet Commonly known as:  LIPITOR Take 20 mg by mouth daily.   Biotin 5000 MCG Caps Take 1 capsule (5,000 mcg total) by mouth  daily.   calcium-vitamin D 500-200 MG-UNIT tablet Commonly known as:  OSCAL WITH D Take 1 tablet by mouth daily.   fluticasone 50 MCG/ACT nasal spray Commonly known as:  FLONASE Place 2 sprays into both nostrils daily.   lisinopril 10 MG tablet Commonly known as:  PRINIVIL,ZESTRIL Take 10 mg by mouth daily.   loratadine 10 MG tablet Commonly known as:  CLARITIN Take 10 mg by mouth daily.   metoprolol succinate 25 MG 24 hr tablet Commonly known as:  TOPROL-XL Take 25 mg by mouth 2 (two) times daily.   mirabegron ER 50 MG Tb24 tablet Commonly known as:  MYRBETRIQ Take 1 tablet (50 mg total) by mouth daily.   oxybutynin 10 MG 24 hr tablet Commonly known as:  DITROPAN-XL Take 10 mg by mouth at bedtime.   pantoprazole 40 MG tablet Commonly known as:  PROTONIX Take 40 mg by mouth daily.   PRESERVISION AREDS PO Take 2 capsules by mouth daily.   sertraline 50 MG tablet Commonly known as:  ZOLOFT Take 50 mg by mouth daily.   tiotropium 18 MCG inhalation capsule Commonly known as:  SPIRIVA Place 18 mcg into inhaler and inhale daily.   Vitamin D3 2000 units capsule Take 1 capsule (2,000 Units total) by mouth daily.       Allergies:  Allergies  Allergen Reactions  . Cephalexin Nausea Only  . Ciprofloxacin Nausea And Vomiting  . Morphine And Related Nausea And Vomiting  . Sulfa Antibiotics Other (See Comments)    Causes yeast infections     Family History: Family History  Problem Relation Age of Onset  . Cancer Mother   . Heart disease Father   . Heart disease Brother   . Breast cancer Neg Hx   . Bladder Cancer Neg Hx   . Kidney cancer Neg Hx     Social History:  reports that she quit smoking about 13 years ago. She has never used smokeless tobacco. She reports that she does not drink alcohol or use drugs.  ROS: UROLOGY Frequent Urination?: Yes Hard to postpone urination?: Yes Burning/pain with urination?: No Get up at night to urinate?: No Leakage  of urine?: Yes Urine stream starts and stops?: No Trouble starting stream?: No Do you have to strain to urinate?: No Blood in urine?: No Urinary tract infection?: No Sexually transmitted disease?: No Injury to kidneys or bladder?: No Painful intercourse?: No Weak stream?: No Currently pregnant?: No Vaginal bleeding?: No Last menstrual period?: n  Gastrointestinal Nausea?: No Vomiting?: No Indigestion/heartburn?: No Diarrhea?: No Constipation?: No  Constitutional Fever: No Night sweats?: No Weight loss?: No Fatigue?: No  Skin Skin rash/lesions?: No Itching?: No  Eyes Blurred vision?: No Double vision?: No  Ears/Nose/Throat Sore throat?: No Sinus problems?: No  Hematologic/Lymphatic Swollen glands?: No Easy bruising?: No  Cardiovascular Leg swelling?: No  Respiratory Cough?: No Shortness of breath?: No  Endocrine Excessive thirst?: No  Musculoskeletal Back pain?: Yes Joint pain?: Yes  Neurological Headaches?: No Dizziness?: No  Psychologic Depression?: No Anxiety?: No  Physical Exam: BP 115/64 (BP Location: Left Arm, Patient Position: Sitting, Cuff Size: Normal)   Pulse 65   Ht 5\' 1"  (1.549 m)   Wt 146 lb 14.4 oz (66.6 kg)   BMI 27.76 kg/m   Constitutional:  Alert and oriented, No acute distress.  Laboratory Data: No results found for: WBC, HGB, HCT, MCV, PLT  Lab Results  Component Value Date   CREATININE 0.70 09/14/2016    No results found for: PSA  No results found for: TESTOSTERONE  No results found for: HGBA1C  Urinalysis    Component Value Date/Time   APPEARANCEUR Clear 06/19/2017 0956   GLUCOSEU Negative 06/19/2017 0956   BILIRUBINUR Negative 06/19/2017 0956   PROTEINUR 3+ (A) 06/19/2017 0956   NITRITE Negative 06/19/2017 0956   LEUKOCYTESUR Negative 06/19/2017 0956    Pertinent Imaging: none  Assessment & Plan:  The patient has mixed incontinence. She can leak a modest amount with a cough. She will try  Vesicare. If this fails I will talk to her by the sling. She agreed with the treatment plan  There are no diagnoses linked to this encounter.  No Follow-up on file.  Reece Packer, MD  [facility] [address] [place], [place] [address] [phone]

## 2017-09-25 ENCOUNTER — Encounter: Payer: Self-pay | Admitting: Urology

## 2017-09-25 ENCOUNTER — Ambulatory Visit (INDEPENDENT_AMBULATORY_CARE_PROVIDER_SITE_OTHER): Payer: Medicare Other | Admitting: Urology

## 2017-09-25 VITALS — BP 124/70 | HR 72 | Ht 61.0 in | Wt 134.1 lb

## 2017-09-25 DIAGNOSIS — N3946 Mixed incontinence: Secondary | ICD-10-CM

## 2017-09-25 LAB — URINALYSIS, COMPLETE
Bilirubin, UA: NEGATIVE
Glucose, UA: NEGATIVE
KETONES UA: NEGATIVE
Leukocytes, UA: NEGATIVE
NITRITE UA: NEGATIVE
PH UA: 5.5 (ref 5.0–7.5)
Protein, UA: NEGATIVE
RBC UA: NEGATIVE
SPEC GRAV UA: 1.025 (ref 1.005–1.030)
UUROB: 1 mg/dL (ref 0.2–1.0)

## 2017-09-25 LAB — BLADDER SCAN AMB NON-IMAGING: Scan Result: 0

## 2017-09-25 LAB — MICROSCOPIC EXAMINATION
RBC, UA: NONE SEEN /hpf (ref 0–?)
WBC UA: NONE SEEN /HPF (ref 0–?)

## 2017-09-25 NOTE — Progress Notes (Signed)
09/25/2017 9:35 AM   Reginia Forts Dec 03, 1941 [id number]  Referring provider: Glendon Axe, MD [facility] [place], [place] [address]  Chief Complaint  Patient presents with  . Mixed incontinence    HPI: I was consulted to assist the patient's urinary incontinence worsening over the last 1 or 2 years. She leaks with coughing sneezing bending and lifting and with urgency and both are significant. She has mild bedwetting. She wears 4 pads per day moderately wet and sometimes soaked.   She voids every 2 hours and gets up 3 or 4 times to urinate. By history she failed oxybutynin and one other medication.She has not been treated with Guy Begin or the beta 3 agonist  Grade 2 hypermobility of bladder neck and a mild positive cough test; moderate vaginal atrophy  On urodynamics the patient's bladder capacity was 244 mL. Bladder was unstable reaching pressures of 3 cm of water. She did not leak but almost did. She had sensory urgency. She had mild to moderate leakage at 100 mL at 63 cm of water. She had the same at 58 cm water 200 mL.   The patient has mixed incontinence. If the patient does not reach the patient's treatment goal with medical and behavioral therapy a sling would be very reasonable to consider   The patient did not respond to the beta 3 agonist.  Today Her incontinence has persisted. She failed medication. She wore 5 pads yesterday. She leaks a moderate amount with coughing and stress maneuvers  Picture was drawn and I spoke about a sling in detail versus watchful waiting.   PMH: Past Medical History:  Diagnosis Date  . Allergy   . Arthritis   . Arthritis   . Cataract   . COPD (chronic obstructive pulmonary disease) ([place])   . GERD (gastroesophageal reflux disease)   . Hyperlipidemia   . Hypertension   . Mitral valve prolapse   . Scoliosis   . Wears dentures     Surgical History: Past Surgical History:  Procedure  Laterality Date  . ABDOMINAL HYSTERECTOMY    . APPENDECTOMY    . ARTHROSCOPY KNEE W/ DRILLING    . biopsy lip    . BREAST EXCISIONAL BIOPSY Right 1982   NEG  . BREAST SURGERY    . BUNIONECTOMY     right  . CARDIAC CATHETERIZATION    . CARDIOVASCULAR STRESS TEST    . CARPAL TUNNEL RELEASE     left  . CARPAL TUNNEL RELEASE    . CATARACT EXTRACTION     left  . CHOLECYSTECTOMY    . cmc joint repair     left  . COLONOSCOPY WITH PROPOFOL N/A 07/19/2016   Procedure: COLONOSCOPY WITH PROPOFOL;  Surgeon: Lollie Sails, MD;  Location: [facility] ENDOSCOPY;  Service: Endoscopy;  Laterality: N/A;  . EYE SURGERY    . HERNIA REPAIR    . HERNIA REPAIR    . JOINT REPLACEMENT     knee replacement  . PLANTAR FASCIA RELEASE Left 11/23/2016   Procedure: ENDOSCOPIC PLANTAR FASCIA RELEASE;  Surgeon: [name], DPM;  Location: [place];  Service: Podiatry;  Laterality: Left;  . REPLACEMENT TOTAL KNEE     left  . right shoulder surgery    . TARSAL TUNNEL RELEASE Left 11/23/2016   Procedure: TARSAL TUNNEL RELEASE;  Surgeon: [name], DPM;  Location: [place];  Service: Podiatry;  Laterality: Left;  POPLITEAL    Home Medications:  Allergies  as of 09/25/2017      Reactions   Cephalexin Nausea Only   Ciprofloxacin Nausea And Vomiting   Morphine And Related Nausea And Vomiting   Sulfa Antibiotics Other (See Comments)   Causes yeast infections      Medication List       Accurate as of 09/25/17  9:35 AM. Always use your most recent med list.          ALBUTEROL IN Inhale into the lungs.   alendronate 70 MG tablet Commonly known as:  FOSAMAX Take 70 mg by mouth once a week. Take with a full glass of water on an empty stomach.   aspirin 81 MG chewable tablet Chew 81 mg by mouth daily.   atorvastatin 20 MG tablet Commonly known as:  LIPITOR Take 20 mg by mouth daily.   Biotin 5000 MCG Caps Take 1 capsule (5,000 mcg total) by mouth daily.   calcium-vitamin D  500-200 MG-UNIT tablet Commonly known as:  OSCAL WITH D Take 1 tablet by mouth daily.   fluticasone 50 MCG/ACT nasal spray Commonly known as:  FLONASE Place 2 sprays into both nostrils daily.   lisinopril 10 MG tablet Commonly known as:  PRINIVIL,ZESTRIL Take 10 mg by mouth daily.   loratadine 10 MG tablet Commonly known as:  CLARITIN Take 10 mg by mouth daily.   metoprolol succinate 25 MG 24 hr tablet Commonly known as:  TOPROL-XL Take 25 mg by mouth 2 (two) times daily.   mirabegron ER 50 MG Tb24 tablet Commonly known as:  MYRBETRIQ Take 1 tablet (50 mg total) by mouth daily.   oxybutynin 10 MG 24 hr tablet Commonly known as:  DITROPAN-XL Take 10 mg by mouth at bedtime.   pantoprazole 40 MG tablet Commonly known as:  PROTONIX Take 40 mg by mouth daily.   PRESERVISION AREDS PO Take 2 capsules by mouth daily.   sertraline 50 MG tablet Commonly known as:  ZOLOFT Take 50 mg by mouth daily.   solifenacin 5 MG tablet Commonly known as:  VESICARE Take 1 tablet (5 mg total) by mouth daily.   tiotropium 18 MCG inhalation capsule Commonly known as:  SPIRIVA Place 18 mcg into inhaler and inhale daily.   Vitamin D3 2000 units capsule Take 1 capsule (2,000 Units total) by mouth daily.       Allergies:  Allergies  Allergen Reactions  . Cephalexin Nausea Only  . Ciprofloxacin Nausea And Vomiting  . Morphine And Related Nausea And Vomiting  . Sulfa Antibiotics Other (See Comments)    Causes yeast infections     Family History: Family History  Problem Relation Age of Onset  . Cancer Mother   . Heart disease Father   . Heart disease Brother   . Breast cancer Neg Hx   . Bladder Cancer Neg Hx   . Kidney cancer Neg Hx     Social History:  reports that she quit smoking about 13 years ago. She has never used smokeless tobacco. She reports that she does not drink alcohol or use drugs.  ROS: UROLOGY Frequent Urination?: Yes Hard to postpone urination?:  Yes Burning/pain with urination?: No Get up at night to urinate?: Yes Leakage of urine?: Yes Urine stream starts and stops?: No Trouble starting stream?: No Do you have to strain to urinate?: No Blood in urine?: No Urinary tract infection?: No Sexually transmitted disease?: No Injury to kidneys or bladder?: No Painful intercourse?: No Weak stream?: No Currently pregnant?: No Vaginal bleeding?: No  Last menstrual period?: n  Gastrointestinal Nausea?: No Vomiting?: No Indigestion/heartburn?: No Diarrhea?: No Constipation?: No  Constitutional Fever: No Night sweats?: No Weight loss?: No Fatigue?: No  Skin Skin rash/lesions?: No Itching?: No  Eyes Blurred vision?: No Double vision?: No  Ears/Nose/Throat Sore throat?: No Sinus problems?: Yes  Hematologic/Lymphatic Swollen glands?: No Easy bruising?: No  Cardiovascular Leg swelling?: No Chest pain?: No  Respiratory Cough?: No Shortness of breath?: No  Endocrine Excessive thirst?: No  Musculoskeletal Back pain?: Yes Joint pain?: Yes  Neurological Headaches?: No Dizziness?: No  Psychologic Depression?: No Anxiety?: No  Physical Exam: BP 124/70 (BP Location: Right Arm, Patient Position: Sitting, Cuff Size: Normal)   Pulse 72   Ht 5\' 1"  (1.549 m)   Wt 60.8 kg (134 lb 1.6 oz)   BMI 25.34 kg/m   Constitutional:  Alert and oriented, No acute distress.   Laboratory Data: No results found for: WBC, HGB, HCT, MCV, PLT  Lab Results  Component Value Date   CREATININE 0.70 09/14/2016     Urinalysis    Component Value Date/Time   APPEARANCEUR Clear 06/19/2017 0956   GLUCOSEU Negative 06/19/2017 0956   BILIRUBINUR Negative 06/19/2017 0956   PROTEINUR 3+ (A) 06/19/2017 0956   NITRITE Negative 06/19/2017 0956   LEUKOCYTESUR Negative 06/19/2017 0956    Pertinent Imaging: none  Assessment & Plan:  The patient would like to proceed with a sling and I think it is a very good treatment option  for her to consider. Mesh issues discussed  There are no diagnoses linked to this encounter.  No Follow-up on file.  Reece Packer, MD  [facility] [address], [place], [place] [address] [phone]

## 2017-09-26 ENCOUNTER — Ambulatory Visit: Payer: Self-pay

## 2017-09-26 ENCOUNTER — Encounter: Payer: Self-pay | Admitting: Urology

## 2017-09-28 ENCOUNTER — Telehealth: Payer: Self-pay

## 2017-09-28 LAB — CULTURE, URINE COMPREHENSIVE

## 2017-09-28 NOTE — Telephone Encounter (Signed)
Patient notified of surgery date on 10-11-17 and pre-op on 10/18@11 :68. Her apt with our office for CIC teaching is on 10-15@10 . Patient verbalized understanding and is in agreement with this plan

## 2017-10-02 ENCOUNTER — Ambulatory Visit (INDEPENDENT_AMBULATORY_CARE_PROVIDER_SITE_OTHER): Payer: Medicare Other

## 2017-10-02 VITALS — BP 128/64 | HR 64 | Ht 61.0 in | Wt 147.2 lb

## 2017-10-02 DIAGNOSIS — N3946 Mixed incontinence: Secondary | ICD-10-CM | POA: Diagnosis not present

## 2017-10-02 NOTE — Progress Notes (Signed)
Continuous Intermittent Catheterization  Due to mixed incontinence patient is present today for a teaching of self I & O Catheterization prior to pubovaginal sling surgery. Patient was given detailed verbal and printed instructions of self catheterization. Patient verbalized understanding. Patient was given a sample bag with supplies to take home.    Preformed by: C. Corinna Capra, CMA

## 2017-10-05 ENCOUNTER — Encounter
Admission: RE | Admit: 2017-10-05 | Discharge: 2017-10-05 | Disposition: A | Discharge status: Home / Self Care | Payer: Medicare Other | Source: Ambulatory Visit | Attending: Urology | Admitting: Urology

## 2017-10-05 DIAGNOSIS — Z79891 Long term (current) use of opiate analgesic: Secondary | ICD-10-CM | POA: Diagnosis not present

## 2017-10-05 DIAGNOSIS — R9431 Abnormal electrocardiogram [ECG] [EKG]: Secondary | ICD-10-CM | POA: Diagnosis not present

## 2017-10-05 DIAGNOSIS — M545 Low back pain: Secondary | ICD-10-CM | POA: Diagnosis not present

## 2017-10-05 DIAGNOSIS — R002 Palpitations: Secondary | ICD-10-CM | POA: Diagnosis not present

## 2017-10-05 DIAGNOSIS — E559 Vitamin D deficiency, unspecified: Secondary | ICD-10-CM | POA: Diagnosis not present

## 2017-10-05 DIAGNOSIS — E782 Mixed hyperlipidemia: Secondary | ICD-10-CM | POA: Insufficient documentation

## 2017-10-05 DIAGNOSIS — M5417 Radiculopathy, lumbosacral region: Secondary | ICD-10-CM | POA: Insufficient documentation

## 2017-10-05 DIAGNOSIS — Z01810 Encounter for preprocedural cardiovascular examination: Secondary | ICD-10-CM | POA: Diagnosis present

## 2017-10-05 DIAGNOSIS — N393 Stress incontinence (female) (male): Secondary | ICD-10-CM | POA: Insufficient documentation

## 2017-10-05 DIAGNOSIS — I10 Essential (primary) hypertension: Secondary | ICD-10-CM | POA: Diagnosis not present

## 2017-10-05 DIAGNOSIS — Z96659 Presence of unspecified artificial knee joint: Secondary | ICD-10-CM | POA: Insufficient documentation

## 2017-10-05 DIAGNOSIS — Z01812 Encounter for preprocedural laboratory examination: Secondary | ICD-10-CM | POA: Diagnosis not present

## 2017-10-05 DIAGNOSIS — J449 Chronic obstructive pulmonary disease, unspecified: Secondary | ICD-10-CM | POA: Diagnosis not present

## 2017-10-05 DIAGNOSIS — F329 Major depressive disorder, single episode, unspecified: Secondary | ICD-10-CM | POA: Insufficient documentation

## 2017-10-05 DIAGNOSIS — M4180 Other forms of scoliosis, site unspecified: Secondary | ICD-10-CM | POA: Insufficient documentation

## 2017-10-05 DIAGNOSIS — G8929 Other chronic pain: Secondary | ICD-10-CM | POA: Insufficient documentation

## 2017-10-05 LAB — BASIC METABOLIC PANEL
Anion gap: 8 (ref 5–15)
BUN: 20 mg/dL (ref 6–20)
CHLORIDE: 102 mmol/L (ref 101–111)
CO2: 29 mmol/L (ref 22–32)
CREATININE: 0.65 mg/dL (ref 0.44–1.00)
Calcium: 9.8 mg/dL (ref 8.9–10.3)
GFR calc Af Amer: >60 mL/min (ref >60)
GFR calc non Af Amer: >60 mL/min (ref >60)
GLUCOSE: 88 mg/dL (ref 65–99)
POTASSIUM: 4.5 mmol/L (ref 3.5–5.1)
SODIUM: 139 mmol/L (ref 135–145)

## 2017-10-05 LAB — CBC
HCT: 39 % (ref 35.0–47.0)
Hemoglobin: 13.1 g/dL (ref 12.0–16.0)
MCH: 30.7 pg (ref 26.0–34.0)
MCHC: 33.6 g/dL (ref 32.0–36.0)
MCV: 91.3 fL (ref 80.0–100.0)
Platelets: 212 10*3/uL (ref 150–440)
RBC: 4.27 MIL/uL (ref 3.80–5.20)
RDW: 13.4 % (ref 11.5–14.5)
WBC: 6.1 10*3/uL (ref 3.6–11.0)

## 2017-10-05 NOTE — Patient Instructions (Signed)
Your procedure is scheduled on: Wed. 10/11/17 Report to Day Surgery. To find out your arrival time please call [phone] between 1PM - 3PM on Tues. 10/10/17.  Remember: Instructions that are not followed completely may result in serious medical risk, up to and including death, or upon the discretion of your surgeon and anesthesiologist your surgery may need to be rescheduled.     _X__ 1. Do not eat food after midnight the night before your procedure.                 No gum chewing or hard candies. You may drink clear liquids up to 2 hours                 before you are scheduled to arrive for your surgery- DO not drink clear                 liquids within 2 hours of the start of your surgery.                 Clear Liquids include:  water, apple juice without pulp, clear carbohydrate                 drink such as Clearfast of Gart[place]ade, Black Coffee or Tea (Do not add                 anything to coffee or tea).     _X__ 2.  No Alcohol for 24 hours before or after surgery.   ___ 3.  Do Not Smoke or use e-cigarettes For 24 Hours Prior to Your Surgery.                 Do not use any chewable tobacco products for at least 6 hours prior to                 surgery.  ____  4.  Bring all medications with you on the day of surgery if instructed.   ____  5.  Notify your doctor if there is any change in your medical condition      (cold, fever, infections).     Do not wear jewelry, make-up, hairpins, clips or nail polish. Do not wear lotions, powders, or perfumes. You may wear deodorant. Do not shave 48 hours prior to surgery. Men may shave face and neck. Do not bring valuables to the hospital.    [facility] is not responsible for any belongings or valuables.  Contacts, dentures or bridgework may not be worn into surgery. Leave your suitcase in the car. After surgery it may be brought to your room. For patients admitted to the hospital, discharge time is determined  by your treatment team.   Patients discharged the day of surgery will not be allowed to drive home.   Please read over the following fact sheets that you were given:   __x__ Take these medicines the morning of surgery with A SIP OF WATER:    1. acetaminophen (TYLENOL) 500 MG tablet  2. fluticasone (FLONASE) 50 MCG/ACT nasal spray  3. sertraline (ZOLOFT) 50 MG tablet  4.metoprolol tartrate (LOPRESSOR) 50 MG tablet  5.  6.  ____ Fleet Enema (as directed)   __x__ Use CHG Soap as directed  _x___ Use inhalers on the day of surgeryalbuterol (PROVENTIL HFA;VENTOLIN HFA) 108 (90 Base) MCG/ACT inhaler and bring to the hospital  ____ Stop metformin 2 days prior to surgery    ____ Take 1/2 of usual insulin dose  the night before surgery. No insulin the morning          of surgery.   __x__ Stop aspirin on today  __x__ Stop Anti-inflammatories on Ibuprofen today. Tylenol OK   __x__ Stop supplements until after surgery.  Biotin 5000 MCG CAPS  ____ Bring C-Pap to the hospital.

## 2017-10-10 DIAGNOSIS — Z90710 Acquired absence of both cervix and uterus: Secondary | ICD-10-CM | POA: Diagnosis not present

## 2017-10-10 DIAGNOSIS — Z809 Family history of malignant neoplasm, unspecified: Secondary | ICD-10-CM | POA: Diagnosis not present

## 2017-10-10 DIAGNOSIS — Z9842 Cataract extraction status, left eye: Secondary | ICD-10-CM | POA: Diagnosis not present

## 2017-10-10 DIAGNOSIS — I341 Nonrheumatic mitral (valve) prolapse: Secondary | ICD-10-CM | POA: Diagnosis not present

## 2017-10-10 DIAGNOSIS — I10 Essential (primary) hypertension: Secondary | ICD-10-CM | POA: Diagnosis not present

## 2017-10-10 DIAGNOSIS — E785 Hyperlipidemia, unspecified: Secondary | ICD-10-CM | POA: Diagnosis not present

## 2017-10-10 DIAGNOSIS — N3289 Other specified disorders of bladder: Secondary | ICD-10-CM | POA: Diagnosis not present

## 2017-10-10 DIAGNOSIS — Z87891 Personal history of nicotine dependence: Secondary | ICD-10-CM | POA: Diagnosis not present

## 2017-10-10 DIAGNOSIS — M419 Scoliosis, unspecified: Secondary | ICD-10-CM | POA: Diagnosis not present

## 2017-10-10 DIAGNOSIS — N3946 Mixed incontinence: Secondary | ICD-10-CM | POA: Diagnosis present

## 2017-10-10 DIAGNOSIS — M1990 Unspecified osteoarthritis, unspecified site: Secondary | ICD-10-CM | POA: Diagnosis not present

## 2017-10-10 DIAGNOSIS — Z8249 Family history of ischemic heart disease and other diseases of the circulatory system: Secondary | ICD-10-CM | POA: Diagnosis not present

## 2017-10-10 DIAGNOSIS — J449 Chronic obstructive pulmonary disease, unspecified: Secondary | ICD-10-CM | POA: Diagnosis not present

## 2017-10-10 DIAGNOSIS — Z882 Allergy status to sulfonamides status: Secondary | ICD-10-CM | POA: Diagnosis not present

## 2017-10-10 DIAGNOSIS — K219 Gastro-esophageal reflux disease without esophagitis: Secondary | ICD-10-CM | POA: Diagnosis not present

## 2017-10-10 DIAGNOSIS — Z79899 Other long term (current) drug therapy: Secondary | ICD-10-CM | POA: Diagnosis not present

## 2017-10-10 DIAGNOSIS — Z885 Allergy status to narcotic agent status: Secondary | ICD-10-CM | POA: Diagnosis not present

## 2017-10-10 DIAGNOSIS — Z7982 Long term (current) use of aspirin: Secondary | ICD-10-CM | POA: Diagnosis not present

## 2017-10-10 DIAGNOSIS — Z881 Allergy status to other antibiotic agents status: Secondary | ICD-10-CM | POA: Diagnosis not present

## 2017-10-10 DIAGNOSIS — Z96652 Presence of left artificial knee joint: Secondary | ICD-10-CM | POA: Diagnosis not present

## 2017-10-10 MED ORDER — CLINDAMYCIN PHOSPHATE 900 MG/50ML IV SOLN: 900.0000 mg | Freq: Once | INTRAVENOUS | Status: DC

## 2017-10-10 NOTE — Progress Notes (Signed)
Pharmacy pre-op antibiotic dosing:  Pharmacy consulted to order gentamicin dose for surgical prophylaxis.   Will order gentamicin 280 mg (5 mg/kg adjusted body weight 56 kg) once on call to OR.  Lenis Noon, PharmD 10/10/17 9:41 PM

## 2017-10-10 NOTE — H&P (Signed)
Chief Complaint  Patient presents with  . Mixed incontinence    HPI: I was consulted to assist the patient's urinary incontinence worsening over the last 1 or 2 years. She leaks with coughing sneezing bending and lifting and with urgency and both are significant. She has mild bedwetting. She wears 4 pads per day moderately wet and sometimes soaked.   She voids every 2 hours and gets up 3 or 4 times to urinate. By history she failed oxybutynin and one other medication.She has not been treated with Guy Begin or the beta 3 agonist  Grade 2 hypermobility of bladder neck and a mild positive cough test; moderate vaginal atrophy  On urodynamics the patient's bladder capacity was 244 mL. Bladder was unstable reaching pressures of 3 cm of water. She did not leak but almost did. She had sensory urgency. She had mild to moderate leakage at 100 mL at 63 cm of water. She had the same at 58 cm water 200 mL.   The patient has mixed incontinence. If the patient does not reach the patient's treatment goal with medical and behavioral therapy a sling would be very reasonable to consider   The patient did not respond to the beta 3 agonist.  Today Her incontinence has persisted. She failed medication. She wore 5 pads yesterday. She leaks a moderate amount with coughing and stress maneuvers  Picture was drawn and I spoke about a sling in detail versus watchful waiting.   PMH:     Past Medical History:  Diagnosis Date  . Allergy   . Arthritis   . Arthritis   . Cataract   . COPD (chronic obstructive pulmonary disease) ([place])   . GERD (gastroesophageal reflux disease)   . Hyperlipidemia   . Hypertension   . Mitral valve prolapse   . Scoliosis   . Wears dentures     Surgical History: Past Surgical History:  Procedure Laterality Date  . ABDOMINAL HYSTERECTOMY    . APPENDECTOMY    . ARTHROSCOPY KNEE W/ DRILLING    . biopsy lip    . BREAST EXCISIONAL BIOPSY  Right 1982   NEG  . BREAST SURGERY    . BUNIONECTOMY     right  . CARDIAC CATHETERIZATION    . CARDIOVASCULAR STRESS TEST    . CARPAL TUNNEL RELEASE     left  . CARPAL TUNNEL RELEASE    . CATARACT EXTRACTION     left  . CHOLECYSTECTOMY    . cmc joint repair     left  . COLONOSCOPY WITH PROPOFOL N/A 07/19/2016   Procedure: COLONOSCOPY WITH PROPOFOL;  Surgeon: Lollie Sails, MD;  Location: [facility] ENDOSCOPY;  Service: Endoscopy;  Laterality: N/A;  . EYE SURGERY    . HERNIA REPAIR    . HERNIA REPAIR    . JOINT REPLACEMENT     knee replacement  . PLANTAR FASCIA RELEASE Left 11/23/2016   Procedure: ENDOSCOPIC PLANTAR FASCIA RELEASE;  Surgeon: [name], DPM;  Location: [place];  Service: Podiatry;  Laterality: Left;  . REPLACEMENT TOTAL KNEE     left  . right shoulder surgery    . TARSAL TUNNEL RELEASE Left 11/23/2016   Procedure: TARSAL TUNNEL RELEASE;  Surgeon: [name], DPM;  Location: [place];  Service: Podiatry;  Laterality: Left;  POPLITEAL    Home Medications:      Allergies as of 09/25/2017      Reactions   Cephalexin Nausea Only   Ciprofloxacin Nausea And Vomiting  Morphine And Related Nausea And Vomiting   Sulfa Antibiotics Other (See Comments)   Causes yeast infections                  Medication List            Accurate as of 09/25/17  9:35 AM. Always use your most recent med list.           ALBUTEROL IN Inhale into the lungs.   alendronate 70 MG tablet Commonly known as:  FOSAMAX Take 70 mg by mouth once a week. Take with a full glass of water on an empty stomach.   aspirin 81 MG chewable tablet Chew 81 mg by mouth daily.   atorvastatin 20 MG tablet Commonly known as:  LIPITOR Take 20 mg by mouth daily.   Biotin 5000 MCG Caps Take 1 capsule (5,000 mcg total) by mouth daily.   calcium-vitamin D 500-200 MG-UNIT tablet Commonly known as:  OSCAL WITH  D Take 1 tablet by mouth daily.   fluticasone 50 MCG/ACT nasal spray Commonly known as:  FLONASE Place 2 sprays into both nostrils daily.   lisinopril 10 MG tablet Commonly known as:  PRINIVIL,ZESTRIL Take 10 mg by mouth daily.   loratadine 10 MG tablet Commonly known as:  CLARITIN Take 10 mg by mouth daily.   metoprolol succinate 25 MG 24 hr tablet Commonly known as:  TOPROL-XL Take 25 mg by mouth 2 (two) times daily.   mirabegron ER 50 MG Tb24 tablet Commonly known as:  MYRBETRIQ Take 1 tablet (50 mg total) by mouth daily.   oxybutynin 10 MG 24 hr tablet Commonly known as:  DITROPAN-XL Take 10 mg by mouth at bedtime.   pantoprazole 40 MG tablet Commonly known as:  PROTONIX Take 40 mg by mouth daily.   PRESERVISION AREDS PO Take 2 capsules by mouth daily.   sertraline 50 MG tablet Commonly known as:  ZOLOFT Take 50 mg by mouth daily.   solifenacin 5 MG tablet Commonly known as:  VESICARE Take 1 tablet (5 mg total) by mouth daily.   tiotropium 18 MCG inhalation capsule Commonly known as:  SPIRIVA Place 18 mcg into inhaler and inhale daily.   Vitamin D3 2000 units capsule Take 1 capsule (2,000 Units total) by mouth daily.       Allergies:       Allergies  Allergen Reactions  . Cephalexin Nausea Only  . Ciprofloxacin Nausea And Vomiting  . Morphine And Related Nausea And Vomiting  . Sulfa Antibiotics Other (See Comments)    Causes yeast infections     Family History:      Family History  Problem Relation Age of Onset  . Cancer Mother   . Heart disease Father   . Heart disease Brother   . Breast cancer Neg Hx   . Bladder Cancer Neg Hx   . Kidney cancer Neg Hx     Social History:  reports that she quit smoking about 13 years ago. She has never used smokeless tobacco. She reports that she does not drink alcohol or use drugs.  ROS: UROLOGY Frequent Urination?: Yes Hard to postpone urination?: Yes Burning/pain  with urination?: No Get up at night to urinate?: Yes Leakage of urine?: Yes Urine stream starts and stops?: No Trouble starting stream?: No Do you have to strain to urinate?: No Blood in urine?: No Urinary tract infection?: No Sexually transmitted disease?: No Injury to kidneys or bladder?: No Painful intercourse?: No Weak stream?: No  Currently pregnant?: No Vaginal bleeding?: No Last menstrual period?: n  Gastrointestinal Nausea?: No Vomiting?: No Indigestion/heartburn?: No Diarrhea?: No Constipation?: No  Constitutional Fever: No Night sweats?: No Weight loss?: No Fatigue?: No  Skin Skin rash/lesions?: No Itching?: No  Eyes Blurred vision?: No Double vision?: No  Ears/Nose/Throat Sore throat?: No Sinus problems?: Yes  Hematologic/Lymphatic Swollen glands?: No Easy bruising?: No  Cardiovascular Leg swelling?: No Chest pain?: No  Respiratory Cough?: No Shortness of breath?: No  Endocrine Excessive thirst?: No  Musculoskeletal Back pain?: Yes Joint pain?: Yes  Neurological Headaches?: No Dizziness?: No  Psychologic Depression?: No Anxiety?: No  Physical Exam: BP 124/70 (BP Location: Right Arm, Patient Position: Sitting, Cuff Size: Normal)   Pulse 72   Ht 5\' 1"  (1.549 m)   Wt 60.8 kg (134 lb 1.6 oz)   BMI 25.34 kg/m   Constitutional:  Alert and oriented, No acute distress.   Laboratory Data: RecentLabs  No results found for: WBC, HGB, HCT, MCV, PLT    RecentLabs       Lab Results  Component Value Date   CREATININE 0.70 09/14/2016       Urinalysis Labs(Brief)     Component Value Date/Time   APPEARANCEUR Clear 06/19/2017 0956   GLUCOSEU Negative 06/19/2017 0956   BILIRUBINUR Negative 06/19/2017 0956   PROTEINUR 3+ (A) 06/19/2017 0956   NITRITE Negative 06/19/2017 0956   LEUKOCYTESUR Negative 06/19/2017 0956      Pertinent Imaging: none  Assessment & Plan:  The patient would like to  proceed with a sling and I think it is a very good treatment option for her to consider. Mesh issues discussed   After a thorough review of the management options for the patient's condition the patient  elected to proceed with surgical therapy as noted above. We have discussed the potential benefits and risks of the procedure, side effects of the proposed treatment, the likelihood of the patient achieving the goals of the procedure, and any potential problems that might occur during the procedure or recuperation. Informed consent has been obtained.

## 2017-10-11 ENCOUNTER — Ambulatory Visit: Payer: Medicare Other | Admitting: Certified Registered"

## 2017-10-11 ENCOUNTER — Encounter: Payer: Self-pay | Admitting: *Deleted

## 2017-10-11 ENCOUNTER — Encounter
Admission: RE | Disposition: A | Discharge status: Home / Self Care | Payer: Self-pay | Source: Ambulatory Visit | Attending: Urology

## 2017-10-11 ENCOUNTER — Ambulatory Visit
Admission: RE | Admit: 2017-10-11 | Discharge: 2017-10-11 | Disposition: A | Discharge status: Home / Self Care | Payer: Medicare Other | Source: Ambulatory Visit | Attending: Urology | Admitting: Urology

## 2017-10-11 DIAGNOSIS — K219 Gastro-esophageal reflux disease without esophagitis: Secondary | ICD-10-CM | POA: Insufficient documentation

## 2017-10-11 DIAGNOSIS — Z885 Allergy status to narcotic agent status: Secondary | ICD-10-CM | POA: Insufficient documentation

## 2017-10-11 DIAGNOSIS — I341 Nonrheumatic mitral (valve) prolapse: Secondary | ICD-10-CM | POA: Insufficient documentation

## 2017-10-11 DIAGNOSIS — N3946 Mixed incontinence: Secondary | ICD-10-CM | POA: Diagnosis not present

## 2017-10-11 DIAGNOSIS — N3289 Other specified disorders of bladder: Secondary | ICD-10-CM | POA: Insufficient documentation

## 2017-10-11 DIAGNOSIS — Z8249 Family history of ischemic heart disease and other diseases of the circulatory system: Secondary | ICD-10-CM | POA: Insufficient documentation

## 2017-10-11 DIAGNOSIS — Z881 Allergy status to other antibiotic agents status: Secondary | ICD-10-CM | POA: Insufficient documentation

## 2017-10-11 DIAGNOSIS — Z90710 Acquired absence of both cervix and uterus: Secondary | ICD-10-CM | POA: Insufficient documentation

## 2017-10-11 DIAGNOSIS — I10 Essential (primary) hypertension: Secondary | ICD-10-CM | POA: Insufficient documentation

## 2017-10-11 DIAGNOSIS — J449 Chronic obstructive pulmonary disease, unspecified: Secondary | ICD-10-CM | POA: Insufficient documentation

## 2017-10-11 DIAGNOSIS — E785 Hyperlipidemia, unspecified: Secondary | ICD-10-CM | POA: Insufficient documentation

## 2017-10-11 DIAGNOSIS — Z9842 Cataract extraction status, left eye: Secondary | ICD-10-CM | POA: Insufficient documentation

## 2017-10-11 DIAGNOSIS — Z809 Family history of malignant neoplasm, unspecified: Secondary | ICD-10-CM | POA: Insufficient documentation

## 2017-10-11 DIAGNOSIS — Z96652 Presence of left artificial knee joint: Secondary | ICD-10-CM | POA: Insufficient documentation

## 2017-10-11 DIAGNOSIS — Z79899 Other long term (current) drug therapy: Secondary | ICD-10-CM | POA: Insufficient documentation

## 2017-10-11 DIAGNOSIS — Z882 Allergy status to sulfonamides status: Secondary | ICD-10-CM | POA: Insufficient documentation

## 2017-10-11 DIAGNOSIS — M1990 Unspecified osteoarthritis, unspecified site: Secondary | ICD-10-CM | POA: Insufficient documentation

## 2017-10-11 DIAGNOSIS — N393 Stress incontinence (female) (male): Secondary | ICD-10-CM | POA: Diagnosis not present

## 2017-10-11 DIAGNOSIS — Z7982 Long term (current) use of aspirin: Secondary | ICD-10-CM | POA: Insufficient documentation

## 2017-10-11 DIAGNOSIS — M419 Scoliosis, unspecified: Secondary | ICD-10-CM | POA: Insufficient documentation

## 2017-10-11 DIAGNOSIS — Z87891 Personal history of nicotine dependence: Secondary | ICD-10-CM | POA: Insufficient documentation

## 2017-10-11 HISTORY — PX: CYSTOSCOPY: SHX5120

## 2017-10-11 HISTORY — PX: PUBOVAGINAL SLING: SHX1035

## 2017-10-11 SURGERY — CREATION, PUBOVAGINAL SLING
Anesthesia: General

## 2017-10-11 MED ORDER — DEXAMETHASONE SODIUM PHOSPHATE 10 MG/ML IJ SOLN
INTRAMUSCULAR | Status: DC | PRN
  Administered 2017-10-11: 4 mg via INTRAVENOUS

## 2017-10-11 MED ORDER — LIDOCAINE HCL (CARDIAC) 20 MG/ML IV SOLN
INTRAVENOUS | Status: DC | PRN
  Administered 2017-10-11: 60 mg via INTRAVENOUS

## 2017-10-11 MED ORDER — LIDOCAINE HCL (PF) 2 % IJ SOLN
INTRAMUSCULAR | Status: AC
Start: 2017-10-11 — End: ?
  Filled 2017-10-11: qty 10

## 2017-10-11 MED ORDER — ACETAMINOPHEN NICU IV SYRINGE 10 MG/ML
INTRAVENOUS | Status: AC
  Filled 2017-10-11: qty 1

## 2017-10-11 MED ORDER — ONDANSETRON HCL 4 MG/2ML IJ SOLN
INTRAMUSCULAR | Status: AC
  Filled 2017-10-11: qty 2

## 2017-10-11 MED ORDER — LACTATED RINGERS IV SOLN
INTRAVENOUS | Status: DC
  Administered 2017-10-11: 12:00:00 via INTRAVENOUS

## 2017-10-11 MED ORDER — CLINDAMYCIN PHOSPHATE 300 MG/50ML IV SOLN
INTRAVENOUS | Status: AC
  Filled 2017-10-11: qty 50

## 2017-10-11 MED ORDER — GENTAMICIN SULFATE 40 MG/ML IJ SOLN
280.0000 mg | INTRAVENOUS | Status: AC
  Administered 2017-10-11: 280 mg via INTRAVENOUS
  Filled 2017-10-11: qty 7

## 2017-10-11 MED ORDER — PROPOFOL 10 MG/ML IV BOLUS
INTRAVENOUS | Status: DC | PRN
  Administered 2017-10-11: 120 mg via INTRAVENOUS

## 2017-10-11 MED ORDER — FENTANYL CITRATE (PF) 100 MCG/2ML IJ SOLN
INTRAMUSCULAR | Status: AC
  Filled 2017-10-11: qty 2

## 2017-10-11 MED ORDER — FAMOTIDINE 20 MG PO TABS
ORAL_TABLET | ORAL | Status: AC
  Administered 2017-10-11: 20 mg via ORAL
  Filled 2017-10-11: qty 1

## 2017-10-11 MED ORDER — FLUORESCEIN SODIUM 10 % IV SOLN
INTRAVENOUS | Status: DC | PRN
  Administered 2017-10-11: 1 mL via INTRAVENOUS

## 2017-10-11 MED ORDER — FAMOTIDINE 20 MG PO TABS
20.0000 mg | ORAL_TABLET | Freq: Once | ORAL | Status: AC
  Administered 2017-10-11: 20 mg via ORAL

## 2017-10-11 MED ORDER — FENTANYL CITRATE (PF) 100 MCG/2ML IJ SOLN
INTRAMUSCULAR | Status: DC | PRN
  Administered 2017-10-11 (×2): 25 ug via INTRAVENOUS
  Administered 2017-10-11: 50 ug via INTRAVENOUS

## 2017-10-11 MED ORDER — ESTROGENS, CONJUGATED 0.625 MG/GM VA CREA
TOPICAL_CREAM | VAGINAL | Status: DC | PRN
  Administered 2017-10-11: 1 via VAGINAL

## 2017-10-11 MED ORDER — STERILE WATER FOR IRRIGATION IR SOLN
Status: DC | PRN
  Administered 2017-10-11: 300 mL

## 2017-10-11 MED ORDER — ONDANSETRON HCL 4 MG/2ML IJ SOLN
INTRAMUSCULAR | Status: DC | PRN
  Administered 2017-10-11: 4 mg via INTRAVENOUS

## 2017-10-11 MED ORDER — DEXAMETHASONE SODIUM PHOSPHATE 10 MG/ML IJ SOLN
INTRAMUSCULAR | Status: AC
  Filled 2017-10-11: qty 1

## 2017-10-11 MED ORDER — LIDOCAINE-EPINEPHRINE (PF) 1 %-1:200000 IJ SOLN
INTRAMUSCULAR | Status: AC
  Filled 2017-10-11: qty 30

## 2017-10-11 MED ORDER — NEOMYCIN-POLYMYXIN B GU 40-200000 IR SOLN
Status: DC | PRN
  Administered 2017-10-11: 1 mL

## 2017-10-11 MED ORDER — MEPERIDINE HCL 50 MG/ML IJ SOLN: 6.2500 mg | INTRAMUSCULAR | Status: DC | PRN

## 2017-10-11 MED ORDER — FENTANYL CITRATE (PF) 100 MCG/2ML IJ SOLN: 25.0000 ug | INTRAMUSCULAR | Status: DC | PRN

## 2017-10-11 MED ORDER — NEOMYCIN-POLYMYXIN B GU 40-200000 IR SOLN
Status: AC
  Filled 2017-10-11: qty 1

## 2017-10-11 MED ORDER — LIDOCAINE-EPINEPHRINE (PF) 1 %-1:200000 IJ SOLN
INTRAMUSCULAR | Status: DC | PRN
  Administered 2017-10-11: 5 mL

## 2017-10-11 MED ORDER — PROMETHAZINE HCL 25 MG/ML IJ SOLN: 6.2500 mg | INTRAMUSCULAR | Status: DC | PRN

## 2017-10-11 MED ORDER — CLINDAMYCIN PHOSPHATE 600 MG/50ML IV SOLN
600.0000 mg | Freq: Once | INTRAVENOUS | Status: AC
  Administered 2017-10-11: 600 mg via INTRAVENOUS

## 2017-10-11 MED ORDER — PROPOFOL 10 MG/ML IV BOLUS
INTRAVENOUS | Status: AC
  Filled 2017-10-11: qty 20

## 2017-10-11 MED ORDER — FLUORESCEIN SODIUM 10 % IV SOLN
INTRAVENOUS | Status: AC
  Filled 2017-10-11: qty 5

## 2017-10-11 SURGICAL SUPPLY — 44 items
BAG URINE DRAINAGE (UROLOGICAL SUPPLIES) IMPLANT
BLADE CLIPPER SURG (BLADE) ×3 IMPLANT
BLADE SURG 15 STRL LF DISP TIS (BLADE) ×1 IMPLANT
BLADE SURG 15 STRL SS (BLADE) ×2
CATH FOLEY SIL 2WAY 14FR5CC (CATHETERS) ×3 IMPLANT
COVER MAYO STAND STRL (DRAPES) ×6 IMPLANT
DERMABOND ADVANCED (GAUZE/BANDAGES/DRESSINGS) ×2
DERMABOND ADVANCED .7 DNX12 (GAUZE/BANDAGES/DRESSINGS) ×1 IMPLANT
DRAPE LAP W/FLUID (DRAPES) ×3 IMPLANT
DRAPE UNDERBUTTOCKS STRL (DRAPE) ×3 IMPLANT
ELECT REM PT RETURN 9FT ADLT (ELECTROSURGICAL) ×3
ELECTRODE REM PT RTRN 9FT ADLT (ELECTROSURGICAL) ×1 IMPLANT
GAUZE SPONGE 4X4 16PLY XRAY LF (GAUZE/BANDAGES/DRESSINGS) IMPLANT
GLOVE BIO SURGEON STRL SZ7.5 (GLOVE) ×6 IMPLANT
GOWN STRL REUS W/ TWL LRG LVL3 (GOWN DISPOSABLE) ×2 IMPLANT
GOWN STRL REUS W/ TWL XL LVL3 (GOWN DISPOSABLE) ×1 IMPLANT
GOWN STRL REUS W/TWL LRG LVL3 (GOWN DISPOSABLE) ×4
GOWN STRL REUS W/TWL XL LVL3 (GOWN DISPOSABLE) ×2
HOLDER FOLEY CATH W/STRAP (MISCELLANEOUS) ×3 IMPLANT
KIT ROOM TURNOVER WOR (KITS) ×3 IMPLANT
NEEDLE HYPO 22GX1.5 SAFETY (NEEDLE) ×3 IMPLANT
PACK BASIN MINOR ARMC (MISCELLANEOUS) ×3 IMPLANT
PENCIL BUTTON HOLSTER BLD 10FT (ELECTRODE) ×3 IMPLANT
PLUG CATH AND CAP STER (CATHETERS) ×6 IMPLANT
RETRACTOR STERILE 25.8CMX11.3 (INSTRUMENTS) IMPLANT
RING RETRACTOR 18.6X8.9 3309G (MISCELLANEOUS) IMPLANT
RING RETRACTOR 28.3X18.3 3308G (MISCELLANEOUS) IMPLANT
SET CYSTO W/LG BORE CLAMP LF (SET/KITS/TRAYS/PACK) ×3 IMPLANT
SLING SUPRIS RETROPUBIC KIT (Miscellaneous) ×3 IMPLANT
SURGILUBE 2OZ TUBE FLIPTOP (MISCELLANEOUS) ×3 IMPLANT
SUT VIC AB 2-0 CT1 27 (SUTURE) ×4
SUT VIC AB 2-0 CT1 TAPERPNT 27 (SUTURE) ×2 IMPLANT
SUT VIC AB 2-0 SH 27 (SUTURE)
SUT VIC AB 2-0 SH 27XBRD (SUTURE) IMPLANT
SUT VICRYL 4-0 PS2 18IN ABS (SUTURE) ×3 IMPLANT
SYR BULB IRRIG 60ML STRL (SYRINGE) ×3 IMPLANT
SYR CONTROL 10ML (SYRINGE) ×3 IMPLANT
SYRINGE 10CC LL (SYRINGE) ×3 IMPLANT
TRAY PREP VAG/GEN (MISCELLANEOUS) ×3 IMPLANT
TUBING CONNECTING 10 (TUBING) ×4 IMPLANT
TUBING CONNECTING 10' (TUBING) ×2
WATER STERILE IRR 1000ML POUR (IV SOLUTION) IMPLANT
WATER STERILE IRR 3000ML UROMA (IV SOLUTION) ×3 IMPLANT
YANKAUER SUCT BULB TIP NO VENT (SUCTIONS) ×3 IMPLANT

## 2017-10-11 NOTE — Anesthesia Postprocedure Evaluation (Signed)
Anesthesia Post Note  Patient: Tina Frey  Procedure(s) Performed: Gaynelle Arabian (N/A ) CYSTOSCOPY (N/A )  Patient location during evaluation: PACU Anesthesia Type: General Level of consciousness: awake and alert and oriented Pain management: pain level controlled Vital Signs Assessment: post-procedure vital signs reviewed and stable Respiratory status: spontaneous breathing, nonlabored ventilation and respiratory function stable Cardiovascular status: blood pressure returned to baseline and stable Postop Assessment: no signs of nausea or vomiting Anesthetic complications: no     Last Vitals:  Vitals:   10/11/17 1426 10/11/17 1430  BP: (!) 156/77 (!) 144/70  Pulse: 79 77  Resp: 16   Temp: 36.6 C   SpO2: 96%     Last Pain:  Vitals:   10/11/17 1426  TempSrc:   PainSc: 0-No pain                 Autum Benfer

## 2017-10-11 NOTE — OR Nursing (Signed)
Post void residual - O. Attempted to call Dr McDiarmet to let him know but no answer and message said his mailbox was full and not excepting messages.

## 2017-10-11 NOTE — Op Note (Signed)
11:59 AM      Preoperative diagnosis: Stress urinary incontinence Postoperative diagnosis: Stress urinary incontinence Surgery: Sling cystourethropexy and cystoscopy Surgeon: Dr. Nicki Reaper Lavere Stork  The patient has the above diagnoses and consented to the above procedure. Preoperative antibiotics were given. Extra care was taken with leg positioning to minimize the risk of compartment syndrome neuropathy and DVT thrombosis. We talked about her urge incontinence again preprocedure.  I made two 1 cm incisions 1 fingerbreadth above the symphysis pubis 1.5 cm lateral to the midline. I marked and made an appropriate 1.5 - 2 cm suburethral incision underneath the mid urethra after instilling 3 mL of a lidocaine epinephrine mixture. At appropriate depth I dissected to the urethrovesical angle bilaterally. The patient had some descensus at rest and had a little bit of a short urethra.  With the bladder emptied I passed a trocar along the top of along the back of the symphysis pubis with my box technique onto the pulp of my index finger bilaterally  I cystoscoped the patient. There was no indentation or movement of bladder with movement of the trocar. There is no bladder injury. There is excellent efflux bilaterally. Urethra was clear- importantly the urethra looked open almost as though it was a bit scarred recognizing the limitation of cystoscopy in this evaluation  With the bladder emptied I attached the mesh sling and brought it up through the retropubic space utilizing the trochars. I tensioned it over the fat part of a moderate size Kelly clamp. There was appropriate tension and position and hypermobility of the sling. There was no springback effect. I was very pleased with the tension. In spite of the cystoscopic findings of the urethra I did not tension the sling differently and was very pleased with that  I closed the anterior vaginal wall with running 2-0 Vicryl on a CT1 needle followed by  interrupted sutures. I cut the mesh below the skin level and closed each abdominal incision with an interrupted 4-0 Vicryl followed by Dermabond. Vaginal pack with estrogen cream applied  Blood loss was less than 100 mL. The procedure went very well. Hopefully it will reach her treatment goal

## 2017-10-11 NOTE — Progress Notes (Signed)
Vitals ok Heart and lung ascultation normal No change clinically

## 2017-10-11 NOTE — Progress Notes (Signed)
Removed plug catheter and vaginal packing per order.  Pt tolerated well.  Denies pain.

## 2017-10-11 NOTE — Anesthesia Preprocedure Evaluation (Signed)
Anesthesia Evaluation  Patient identified by MRN, date of birth, ID band Patient awake    Reviewed: Allergy & Precautions, NPO status , Patient's Chart, lab work & pertinent test results  History of Anesthesia Complications Negative for: history of anesthetic complications  Airway Mallampati: II  TM Distance: >3 FB Neck ROM: Full    Dental  (+) Upper Dentures, Partial Lower   Pulmonary neg sleep apnea, COPD, former smoker,    breath sounds clear to auscultation- rhonchi (-) wheezing      Cardiovascular hypertension, Pt. on medications (-) CAD, (-) Past MI and (-) Cardiac Stents  Rhythm:Regular Rate:Normal - Systolic murmurs and - Diastolic murmurs NM perfusion study 05/24/16: no evidence of ischemia  Echo 05/24/16: NORMAL LEFT VENTRICULAR SYSTOLIC FUNCTION NORMAL RIGHT VENTRICULAR SYSTOLIC FUNCTION MODERATE VALVULAR REGURGITATION (See above) NO VALVULAR STENOSIS MODERATE LA ENLARGEMENT MILD MITRAL VALVE PROLAPSE   Neuro/Psych PSYCHIATRIC DISORDERS Depression negative neurological ROS     GI/Hepatic Neg liver ROS, GERD  ,  Endo/Other  negative endocrine ROSneg diabetes  Renal/GU negative Renal ROS     Musculoskeletal  (+) Arthritis ,   Abdominal (+) - obese,   Peds  Hematology negative hematology ROS (+)   Anesthesia Other Findings Past Medical History: No date: Allergy No date: Arthritis No date: Arthritis No date: Cataract No date: COPD (chronic obstructive pulmonary disease) (HCC) No date: GERD (gastroesophageal reflux disease) No date: Hyperlipidemia No date: Hypertension No date: Mitral valve prolapse No date: Scoliosis No date: Wears dentures   Reproductive/Obstetrics                             Anesthesia Physical Anesthesia Plan  ASA: II  Anesthesia Plan: General   Post-op Pain Management:    Induction: Intravenous  PONV Risk Score and Plan: 2 and Ondansetron and  Dexamethasone  Airway Management Planned: LMA  Additional Equipment:   Intra-op Plan:   Post-operative Plan:   Informed Consent: I have reviewed the patients History and Physical, chart, labs and discussed the procedure including the risks, benefits and alternatives for the proposed anesthesia with the patient or authorized representative who has indicated his/her understanding and acceptance.   Dental advisory given  Plan Discussed with: CRNA and Anesthesiologist  Anesthesia Plan Comments:         Anesthesia Quick Evaluation

## 2017-10-11 NOTE — Anesthesia Procedure Notes (Signed)
Procedure Name: LMA Insertion Performed by: Naphtali Zywicki Pre-anesthesia Checklist: Patient identified, Patient being monitored, Timeout performed, Emergency Drugs available and Suction available Patient Re-evaluated:Patient Re-evaluated prior to induction Oxygen Delivery Method: Circle system utilized Preoxygenation: Pre-oxygenation with 100% oxygen Induction Type: IV induction LMA: LMA inserted LMA Size: 3.5 Tube type: Oral Number of attempts: 1 Placement Confirmation: positive ETCO2 and breath sounds checked- equal and bilateral Tube secured with: Tape Dental Injury: Teeth and Oropharynx as per pre-operative assessment        

## 2017-10-11 NOTE — Discharge Instructions (Signed)
I have reviewed discharge instructions in detail with the patient. They will follow-up with me or their physician as scheduled. My nurse will also be calling the patients as per protocol.  °AMBULATORY SURGERY  °DISCHARGE INSTRUCTIONS ° ° °1) The drugs that you were given will stay in your system until tomorrow so for the next 24 hours you should not: ° °A) Drive an automobile °B) Make any legal decisions °C) Drink any alcoholic beverage ° ° °2) You may resume regular meals tomorrow.  Today it is better to start with liquids and gradually work up to solid foods. ° °You may eat anything you prefer, but it is better to start with liquids, then soup and crackers, and gradually work up to solid foods. ° ° °3) Please notify your doctor immediately if you have any unusual bleeding, trouble breathing, redness and pain at the surgery site, drainage, fever, or pain not relieved by medication. ° ° ° °4) Additional Instructions: ° ° ° ° ° ° ° °Please contact your physician with any problems or Same Day Surgery at [phone], Monday through Friday 6 am to 4 pm, or [place] [facility] [place] Main number at [phone]. °

## 2017-10-11 NOTE — Interval H&P Note (Signed)
History and Physical Interval Note:  10/11/2017 11:58 AM  Tina Frey  has presented today for surgery, with the diagnosis of STRESS INCONTINENCE  The various methods of treatment have been discussed with the patient and family. After consideration of risks, benefits and other options for treatment, the patient has consented to  Procedure(s): PUBO-VAGINAL SLING (N/A) CYSTOSCOPY (N/A) as a surgical intervention .  The patient's history has been reviewed, patient examined, no change in status, stable for surgery.  I have reviewed the patient's chart and labs.  Questions were answered to the patient's satisfaction.     Rocko Fesperman A

## 2017-10-11 NOTE — Transfer of Care (Signed)
Immediate Anesthesia Transfer of Care Note  Patient: Tina Frey  Procedure(s) Performed: Gaynelle Arabian (N/A ) CYSTOSCOPY (N/A )  Patient Location: PACU  Anesthesia Type:General  Level of Consciousness: sedated and responds to stimulation  Airway & Oxygen Therapy: Patient Spontanous Breathing and Patient connected to face mask oxygen  Post-op Assessment: Report given to RN and Post -op Vital signs reviewed and stable  Post vital signs: Reviewed and stable  Last Vitals:  Vitals:   10/11/17 1334 10/11/17 1336  BP: (!) 143/87 (!) 143/87  Pulse: 95 89  Resp: 18 20  Temp: 36.8 C   SpO2: 99% 100%    Last Pain:  Vitals:   10/11/17 1102  TempSrc: Tympanic         Complications: No apparent anesthesia complications

## 2017-10-11 NOTE — Anesthesia Post-op Follow-up Note (Signed)
Anesthesia QCDR form completed.        

## 2017-10-16 ENCOUNTER — Encounter: Payer: Self-pay | Admitting: Urology

## 2017-10-16 ENCOUNTER — Ambulatory Visit (INDEPENDENT_AMBULATORY_CARE_PROVIDER_SITE_OTHER): Payer: Medicare Other | Admitting: Urology

## 2017-10-16 VITALS — BP 122/64 | HR 79 | Ht 61.0 in | Wt 137.0 lb

## 2017-10-16 DIAGNOSIS — N3946 Mixed incontinence: Secondary | ICD-10-CM

## 2017-10-16 NOTE — Progress Notes (Signed)
10/16/2017 9:35 AM   Tina Frey 1941-05-22 [id number]  Referring provider: Glendon Axe, MD [facility] [place], [place] [address]  Chief Complaint  Patient presents with  . Routine Post Op    HPI: The patient had a sling last week.  Currently voiding well.  She does feel empty.  Initial residual was less than 20 mL.  She is continent and very pleased  Appropriate grade 1 hypermobility the bladder neck and no stress incontinence with a well-healing incision line   PMH: Past Medical History:  Diagnosis Date  . Allergy   . Arthritis   . Arthritis   . Cataract   . COPD (chronic obstructive pulmonary disease) ([place])   . GERD (gastroesophageal reflux disease)   . Hyperlipidemia   . Hypertension   . Mitral valve prolapse   . Scoliosis   . Wears dentures     Surgical History: Past Surgical History:  Procedure Laterality Date  . ABDOMINAL HYSTERECTOMY    . APPENDECTOMY    . ARTHROSCOPY KNEE W/ DRILLING    . biopsy lip    . BREAST EXCISIONAL BIOPSY Right 1982   NEG  . BREAST SURGERY Right    biopsy /benigning  . BUNIONECTOMY     right  . CARDIAC CATHETERIZATION    . CARDIOVASCULAR STRESS TEST    . CARPAL TUNNEL RELEASE     left  . CARPAL TUNNEL RELEASE    . CATARACT EXTRACTION     left  . CHOLECYSTECTOMY    . cmc joint repair     left  . COLONOSCOPY WITH PROPOFOL N/A 07/19/2016   Procedure: COLONOSCOPY WITH PROPOFOL;  Surgeon: Lollie Sails, MD;  Location: [facility] END[facility]COPY;  Service: Endoscopy;  Laterality: N/A;  . CYSTOSCOPY N/A 10/11/2017   Procedure: CYSTOSCOPY;  Surgeon: Bjorn Loser, MD;  Location: [facility] ORS;  Service: Urology;  Laterality: N/A;  . EYE SURGERY     bilateral  . HERNIA REPAIR     bilateral inguinal  . JOINT REPLACEMENT Left    knee replacement  . PLANTAR FASCIA RELEASE Left 11/23/2016   Procedure: ENDOSCOPIC PLANTAR FASCIA RELEASE;  Surgeon: [name], DPM;  Location: [facility];   Service: Podiatry;  Laterality: Left;  . PUBOVAGINAL SLING N/A 10/11/2017   Procedure: Gaynelle Arabian;  Surgeon: Bjorn Loser, MD;  Location: [facility] ORS;  Service: Urology;  Laterality: N/A;  . REPLACEMENT TOTAL KNEE     left  . right shoulder surgery    . TARSAL TUNNEL RELEASE Left 11/23/2016   Procedure: TARSAL TUNNEL RELEASE;  Surgeon: [name], DPM;  Location: [place];  Service: Podiatry;  Laterality: Left;  POPLITEAL    Home Medications:  Allergies as of 10/16/2017      Reactions   Ciprofloxacin Nausea And Vomiting   Morphine And Related Nausea And Vomiting   Cephalexin Nausea Only   Sulfa Antibiotics Other (See Comments)   Causes yeast infections      Medication List       Accurate as of 10/16/17  9:35 AM. Always use your most recent med list.          acetaminophen 500 MG tablet Commonly known as:  TYLENOL Take 1,000 mg by mouth every 8 (eight) hours as needed for mild pain or headache.   albuterol 108 (90 Base) MCG/ACT inhaler Commonly known as:  PROVENTIL HFA;VENTOLIN HFA Inhale 2 puffs into the lungs every 6 (six) hours as needed for wheezing or shortness  of breath.   atorvastatin 20 MG tablet Commonly known as:  LIPITOR Take 20 mg by mouth daily.   Biotin 5000 MCG Caps Take 1 capsule (5,000 mcg total) by mouth daily.   fluticasone 50 MCG/ACT nasal spray Commonly known as:  FLONASE Place 1 spray into both nostrils daily as needed for allergies or rhinitis.   lisinopril 10 MG tablet Commonly known as:  PRINIVIL,ZESTRIL Take 10 mg by mouth daily.   metoprolol tartrate 50 MG tablet Commonly known as:  LOPRESSOR Take 25 mg by mouth 2 (two) times daily.   montelukast 10 MG tablet Commonly known as:  SINGULAIR Take 10 mg by mouth at bedtime.   PRESERVISION AREDS PO Take 1 capsule by mouth 2 (two) times daily.   sertraline 50 MG tablet Commonly known as:  ZOLOFT Take 50 mg by mouth daily.   Vitamin D3 2000 units capsule Take  1 capsule (2,000 Units total) by mouth daily.       Allergies:  Allergies  Allergen Reactions  . Ciprofloxacin Nausea And Vomiting  . Morphine And Related Nausea And Vomiting  . Cephalexin Nausea Only  . Sulfa Antibiotics Other (See Comments)    Causes yeast infections     Family History: Family History  Problem Relation Age of Onset  . Cancer Mother   . Heart disease Father   . Heart disease Brother   . Breast cancer Neg Hx   . Bladder Cancer Neg Hx   . Kidney cancer Neg Hx     Social History:  reports that she quit smoking about 13 years ago. She has never used smokeless tobacco. She reports that she does not drink alcohol or use drugs.  ROS: UROLOGY Frequent Urination?: No Hard to postpone urination?: No Burning/pain with urination?: No Get up at night to urinate?: No Leakage of urine?: No Urine stream starts and stops?: No Trouble starting stream?: No Do you have to strain to urinate?: No Blood in urine?: No Urinary tract infection?: No Sexually transmitted disease?: No Injury to kidneys or bladder?: No Painful intercourse?: No Weak stream?: No Currently pregnant?: No Vaginal bleeding?: No Last menstrual period?: n  Gastrointestinal Nausea?: No Vomiting?: No Indigestion/heartburn?: No Diarrhea?: No Constipation?: No  Constitutional Fever: No Night sweats?: No Weight loss?: No Fatigue?: No  Skin Skin rash/lesions?: No Itching?: No  Eyes Blurred vision?: No Double vision?: No  Ears/Nose/Throat Sore throat?: No Sinus problems?: No  Hematologic/Lymphatic Swollen glands?: No Easy bruising?: No  Cardiovascular Leg swelling?: No Chest pain?: No  Respiratory Cough?: No Shortness of breath?: No  Endocrine Excessive thirst?: No  Musculoskeletal Back pain?: Yes Joint pain?: Yes  Neurological Headaches?: No Dizziness?: No  Psychologic Depression?: No Anxiety?: No  Physical Exam: BP 122/64 (BP Location: Left Arm, Patient  Position: Sitting, Cuff Size: Normal)   Pulse 79   Ht 5\' 1"  (1.549 m)   Wt 137 lb (62.1 kg)   BMI 25.89 kg/m   Constitutional:  Alert and oriented, No acute distress.   Laboratory Data: Lab Results  Component Value Date   WBC 6.1 10/05/2017   HGB 13.1 10/05/2017   HCT 39.0 10/05/2017   MCV 91.3 10/05/2017   PLT 212 10/05/2017    Lab Results  Component Value Date   CREATININE 0.65 10/05/2017    No results found for: PSA  No results found for: TESTOSTERONE  No results found for: HGBA1C  Urinalysis    Component Value Date/Time   APPEARANCEUR Clear 09/25/2017 0947   GLUCOSEU  Negative 09/25/2017 0947   BILIRUBINUR Negative 09/25/2017 0947   PROTEINUR Negative 09/25/2017 0947   NITRITE Negative 09/25/2017 0947   LEUKOCYTESUR Negative 09/25/2017 0947    Pertinent Imaging: none  Assessment & Plan: The patient had a sling last week and is doing very well.  I will reassess her in 10 weeks  There are no diagnoses linked to this encounter.  No Follow-up on file.  Reece Packer, MD  [facility] [address], [place], [place] [address] [phone]

## 2017-11-27 ENCOUNTER — Ambulatory Visit: Payer: Medicare Other

## 2017-12-25 ENCOUNTER — Encounter: Payer: Self-pay | Admitting: Urology

## 2017-12-25 ENCOUNTER — Ambulatory Visit: Payer: Medicare Other | Admitting: Urology

## 2017-12-25 VITALS — BP 153/73 | HR 87 | Ht 61.0 in | Wt 145.0 lb

## 2017-12-25 DIAGNOSIS — R32 Unspecified urinary incontinence: Secondary | ICD-10-CM

## 2017-12-25 LAB — BLADDER SCAN AMB NON-IMAGING

## 2017-12-25 NOTE — Progress Notes (Signed)
12/25/2017 9:19 AM   Tina Frey [birth year] [id number]  Referring provider: Glendon Axe, [facility] [place], [place] [address]  Chief Complaint  Patient presents with  . Urinary Incontinence    post sling    HPI: The patient had mixed incontinence and failed medication including 2 antimuscarinics and Myrbetriq.  She had a sling October 11, 2017.  She was continent on her first visit and this is her 12-week checkup  Today Patient is continent with no stress incontinence.  If she holds it too long she can leak a few drops associated with urgency.  Voiding well.  No pain.  Frequency stable On pelvic examination the patient had a grade 1 hypermobility of the bladder neck with a negative cough test and no sling extrusion and no tenderness   PMH: Past Medical History:  Diagnosis Date  . Allergy   . Arthritis   . Arthritis   . Cataract   . COPD (chronic obstructive pulmonary disease) ([place])   . GERD (gastroesophageal reflux disease)   . Hyperlipidemia   . Hypertension   . Mitral valve prolapse   . Scoliosis   . Wears dentures     Surgical History: Past Surgical History:  Procedure Laterality Date  . ABDOMINAL HYSTERECTOMY    . APPENDECTOMY    . ARTHROSCOPY KNEE W/ DRILLING    . biopsy lip    . BREAST EXCISIONAL BIOPSY Right 1982   NEG  . BREAST SURGERY Right    biopsy /benigning  . BUNIONECTOMY     right  . CARDIAC CATHETERIZATION    . CARDIOVASCULAR STRESS TEST    . CARPAL TUNNEL RELEASE     left  . CARPAL TUNNEL RELEASE    . CATARACT EXTRACTION     left  . CHOLECYSTECTOMY    . cmc joint repair     left  . COLONOSCOPY WITH PROPOFOL N/A 07/19/2016   Procedure: COLONOSCOPY WITH PROPOFOL;  Surgeon: Lollie Sails, MD;  Location: [facility] ENDOSCOPY;  Service: Endoscopy;  Laterality: N/A;  . CYSTOSCOPY N/A 10/11/2017   Procedure: CYSTOSCOPY;  Surgeon: Bjorn Loser, MD;  Location: [facility] ORS;  Service: Urology;  Laterality:  N/A;  . EYE SURGERY     bilateral  . HERNIA REPAIR     bilateral inguinal  . JOINT REPLACEMENT Left    knee replacement  . PLANTAR FASCIA RELEASE Left 11/23/2016   Procedure: ENDOSCOPIC PLANTAR FASCIA RELEASE;  Surgeon: [name], DPM;  Location: [facility];  Service: Podiatry;  Laterality: Left;  . PUBOVAGINAL SLING N/A 10/11/2017   Procedure: Gaynelle Arabian;  Surgeon: Bjorn Loser, MD;  Location: [facility] ORS;  Service: Urology;  Laterality: N/A;  . REPLACEMENT TOTAL KNEE     left  . right shoulder surgery    . TARSAL TUNNEL RELEASE Left 11/23/2016   Procedure: TARSAL TUNNEL RELEASE;  Surgeon: [name], DPM;  Location: [place];  Service: Podiatry;  Laterality: Left;  POPLITEAL    Home Medications:  Allergies as of 12/25/2017      Reactions   Ciprofloxacin Nausea And Vomiting   Morphine And Related Nausea And Vomiting   Cephalexin Nausea Only   Sulfa Antibiotics Other (See Comments)   Causes yeast infections      Medication List        Accurate as of 12/25/17  9:19 AM. Always use your most recent med list.          acetaminophen 500 MG tablet  Commonly known as:  TYLENOL Take 1,000 mg by mouth every 8 (eight) hours as needed for mild pain or headache.   albuterol 108 (90 Base) MCG/ACT inhaler Commonly known as:  PROVENTIL HFA;VENTOLIN HFA Inhale 2 puffs into the lungs every 6 (six) hours as needed for wheezing or shortness of breath.   atorvastatin 20 MG tablet Commonly known as:  LIPITOR Take 20 mg by mouth daily.   Biotin 5000 MCG Caps Take 1 capsule (5,000 mcg total) by mouth daily.   fluticasone 50 MCG/ACT nasal spray Commonly known as:  FLONASE Place 1 spray into both nostrils daily as needed for allergies or rhinitis.   lisinopril 10 MG tablet Commonly known as:  PRINIVIL,ZESTRIL Take 10 mg by mouth daily.   metoprolol tartrate 50 MG tablet Commonly known as:  LOPRESSOR Take 25 mg by mouth 2 (two) times daily.     montelukast 10 MG tablet Commonly known as:  SINGULAIR Take 10 mg by mouth at bedtime.   PRESERVISION AREDS PO Take 1 capsule by mouth 2 (two) times daily.   sertraline 50 MG tablet Commonly known as:  ZOLOFT Take 50 mg by mouth daily.   Vitamin D3 2000 units capsule Take 1 capsule (2,000 Units total) by mouth daily.       Allergies:  Allergies  Allergen Reactions  . Ciprofloxacin Nausea And Vomiting  . Morphine And Related Nausea And Vomiting  . Cephalexin Nausea Only  . Sulfa Antibiotics Other (See Comments)    Causes yeast infections     Family History: Family History  Problem Relation Age of Onset  . Cancer Mother   . Heart disease Father   . Heart disease Brother   . Breast cancer Neg Hx   . Bladder Cancer Neg Hx   . Kidney cancer Neg Hx     Social History:  reports that she quit smoking about 14 years ago. she has never used smokeless tobacco. She reports that she does not drink alcohol or use drugs.  ROS: UROLOGY Frequent Urination?: No Hard to postpone urination?: No Burning/pain with urination?: No Get up at night to urinate?: No Leakage of urine?: No Urine stream starts and stops?: No Trouble starting stream?: No Do you have to strain to urinate?: No Blood in urine?: No Urinary tract infection?: No Sexually transmitted disease?: No Injury to kidneys or bladder?: No Painful intercourse?: No Weak stream?: No Currently pregnant?: No Vaginal bleeding?: No Last menstrual period?: n  Gastrointestinal Nausea?: No Vomiting?: No Indigestion/heartburn?: No Diarrhea?: No Constipation?: No  Constitutional Fever: No Night sweats?: No Weight loss?: No Fatigue?: No  Skin Skin rash/lesions?: No Itching?: No  Eyes Blurred vision?: No Double vision?: No  Ears/Nose/Throat Sore throat?: No Sinus problems?: Yes  Hematologic/Lymphatic Swollen glands?: No Easy bruising?: No  Cardiovascular Leg swelling?: No Chest pain?:  No  Respiratory Cough?: No Shortness of breath?: No  Endocrine Excessive thirst?: No  Musculoskeletal Back pain?: Yes Joint pain?: Yes  Neurological Headaches?: No Dizziness?: No  Psychologic Depression?: No Anxiety?: No  Physical Exam: BP (!) 153/73   Pulse 87   Ht 5\' 1"  (1.549 m)   Wt 65.8 kg (145 lb)   BMI 27.40 kg/m   Constitutional:  Alert and oriented, No acute distress.   Laboratory Data: Lab Results  Component Value Date   WBC 6.1 10/05/2017   HGB 13.1 10/05/2017   HCT 39.0 10/05/2017   MCV 91.3 10/05/2017   PLT 212 10/05/2017    Lab Results  Component  Value Date   CREATININE 0.65 10/05/2017    No results found for: PSA  No results found for: TESTOSTERONE  No results found for: HGBA1C  Urinalysis    Component Value Date/Time   APPEARANCEUR Clear 09/25/2017 0947   GLUCOSEU Negative 09/25/2017 0947   BILIRUBINUR Negative 09/25/2017 0947   PROTEINUR Negative 09/25/2017 0947   NITRITE Negative 09/25/2017 0947   LEUKOCYTESUR Negative 09/25/2017 0947    Pertinent Imaging: None  Assessment & Plan: I am very pleased with the patient's progress.  I will see her as needed  1. Urinary incontinence, unspecified type . frequency - BLADDER SCAN AMB NON-IMAGING   No Follow-up on file.  Reece Packer, MD  [facility] [address], [place], [place] [address] [phone]

## 2018-08-08 ENCOUNTER — Other Ambulatory Visit: Payer: Self-pay | Admitting: Internal Medicine

## 2018-08-08 DIAGNOSIS — Z1231 Encounter for screening mammogram for malignant neoplasm of breast: Secondary | ICD-10-CM

## 2018-08-24 ENCOUNTER — Ambulatory Visit
Admission: RE | Admit: 2018-08-24 | Discharge: 2018-08-24 | Disposition: A | Discharge status: Home / Self Care | Payer: Medicare Other | Source: Ambulatory Visit | Attending: Internal Medicine | Admitting: Internal Medicine

## 2018-08-24 DIAGNOSIS — Z1231 Encounter for screening mammogram for malignant neoplasm of breast: Secondary | ICD-10-CM | POA: Diagnosis present

## 2018-08-24 HISTORY — DX: Malignant (primary) neoplasm, unspecified: C80.1

## 2019-02-06 ENCOUNTER — Other Ambulatory Visit: Payer: Self-pay

## 2019-02-06 ENCOUNTER — Encounter: Payer: Self-pay | Admitting: Emergency Medicine

## 2019-02-06 ENCOUNTER — Emergency Department
Admission: EM | Admit: 2019-02-06 | Discharge: 2019-02-06 | Disposition: A | Discharge status: Home / Self Care | Payer: Medicare Other | Attending: Emergency Medicine | Admitting: Emergency Medicine

## 2019-02-06 DIAGNOSIS — Y998 Other external cause status: Secondary | ICD-10-CM | POA: Insufficient documentation

## 2019-02-06 DIAGNOSIS — E785 Hyperlipidemia, unspecified: Secondary | ICD-10-CM | POA: Diagnosis not present

## 2019-02-06 DIAGNOSIS — I10 Essential (primary) hypertension: Secondary | ICD-10-CM | POA: Diagnosis not present

## 2019-02-06 DIAGNOSIS — W260XXA Contact with knife, initial encounter: Secondary | ICD-10-CM | POA: Diagnosis not present

## 2019-02-06 DIAGNOSIS — Z23 Encounter for immunization: Secondary | ICD-10-CM | POA: Insufficient documentation

## 2019-02-06 DIAGNOSIS — S61211A Laceration without foreign body of left index finger without damage to nail, initial encounter: Secondary | ICD-10-CM | POA: Diagnosis present

## 2019-02-06 DIAGNOSIS — J449 Chronic obstructive pulmonary disease, unspecified: Secondary | ICD-10-CM | POA: Insufficient documentation

## 2019-02-06 DIAGNOSIS — Y93G1 Activity, food preparation and clean up: Secondary | ICD-10-CM | POA: Diagnosis not present

## 2019-02-06 DIAGNOSIS — Y92000 Kitchen of unspecified non-institutional (private) residence as  the place of occurrence of the external cause: Secondary | ICD-10-CM | POA: Diagnosis not present

## 2019-02-06 MED ORDER — TETANUS-DIPHTH-ACELL PERTUSSIS 5-2.5-18.5 LF-MCG/0.5 IM SUSP
0.5000 mL | Freq: Once | INTRAMUSCULAR | Status: AC
  Administered 2019-02-06: 0.5 mL via INTRAMUSCULAR
  Filled 2019-02-06: qty 0.5

## 2019-02-06 MED ORDER — LIDOCAINE-EPINEPHRINE-TETRACAINE (LET) SOLUTION
3.0000 mL | Freq: Once | NASAL | Status: AC
  Administered 2019-02-06: 3 mL via TOPICAL
  Filled 2019-02-06: qty 3

## 2019-02-06 NOTE — ED Provider Notes (Signed)
[facility] Emergency Department Provider Note   ____________________________________________   First MD Initiated Contact with Patient 02/06/19 0840     (approximate)  I have reviewed the triage vital signs and the nursing notes.   HISTORY  Chief Complaint Extremity Laceration    HPI Tina Frey is a 78 y.o. female patient presents for laceration to left index finger.  Incident occurred last night while she was peeling potatoes.  Patient state intimating bleeding.  Patient does take aspirin this.  No other blood thinners.  Patient denies loss sensation or loss of function.  Patient is right-hand dominant.  Patient unsure of tetanus status.  Patient rates her pain as 8/10.  Patient described the pain as "sore".  Pressure dressing applied in triage.    Past Medical History:  Diagnosis Date  . Allergy   . Arthritis   . Arthritis   . Cancer ([place])    basal cell  . Cataract   . COPD (chronic obstructive pulmonary disease) ([place])   . GERD (gastroesophageal reflux disease)   . Hyperlipidemia   . Hypertension   . Mitral valve prolapse   . Scoliosis   . Wears dentures     Patient Active Problem List   Diagnosis Date Noted  . Vitamin D insufficiency 03/03/2016  . Chronic lower extremity pain (Location of Primary Source of Pain) (S1 Dermatome) (Left) 02/24/2016  . Levoscoliosis 11/30/2015  . Lumbar facet hypertrophy (L2-3, L3-4, L4-L5, L5-S1) 11/30/2015  . Thoracic disc herniation (T12-L1 disc protrusion) 11/30/2015  . Lumbar disc bulging (L2-3, L3-4, and L4-5) 11/30/2015  . Lumbar disc herniation  (Right) (L1-2: Small right-sided disc protrusion with upgoing disc material) 11/30/2015  . H/O total knee replacement 10/16/2015  . Encounter for therapeutic drug level monitoring 10/12/2015  . Long term current use of opiate analgesic 10/12/2015  . Long term prescription opiate use 10/12/2015  . Uncomplicated opioid dependence ([place]) 10/12/2015  . Opiate  use (90 MME/Day) 10/12/2015  . Lumbar facet syndrome (L>R) 10/12/2015  . Lumbosacral radiculopathy (Left) (S1 Dermatome) 10/12/2015  . Lumbar spondylosis 10/12/2015  . Chronic low back pain 10/12/2015  . Chronic pain 10/12/2015  . AI (aortic incompetence) 09/23/2015  . Benign essential HTN 08/20/2015  . Awareness of heartbeats 03/05/2015  . Combined fat and carbohydrate induced hyperlipemia 11/16/2014  . MI (mitral incompetence) 11/10/2014  . 3-vessel CAD 10/23/2014  . Chronic obstructive pulmonary disease ([place]) 10/10/2014  . Clinical depression 10/10/2014  . 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 10/10/2014    Past Surgical History:  Procedure Laterality Date  . ABDOMINAL HYSTERECTOMY    . APPENDECTOMY    . ARTHROSCOPY KNEE W/ DRILLING    . biopsy lip    . BREAST EXCISIONAL BIOPSY Right 1982   NEG  . BREAST SURGERY Right    biopsy /benigning  . BUNIONECTOMY     right  . CARDIAC CATHETERIZATION    . CARDIOVASCULAR STRESS TEST    . CARPAL TUNNEL RELEASE     left  . CARPAL TUNNEL RELEASE    . CATARACT EXTRACTION     left  . CHOLECYSTECTOMY    . cmc joint repair     left  . COLONOSCOPY WITH PROPOFOL N/A 07/19/2016   Procedure: COLONOSCOPY WITH PROPOFOL;  Surgeon: Lollie Sails, MD;  Location: [facility] ENDOSCOPY;  Service: Endoscopy;  Laterality: N/A;  . CYSTOSCOPY N/A 10/11/2017   Procedure: CYSTOSCOPY;  Surgeon: Bjorn Loser, MD;  Location: [facility] ORS;  Service: Urology;  Laterality: N/A;  .  EYE SURGERY     bilateral  . HERNIA REPAIR     bilateral inguinal  . JOINT REPLACEMENT Left    knee replacement  . PLANTAR FASCIA RELEASE Left 11/23/2016   Procedure: ENDOSCOPIC PLANTAR FASCIA RELEASE;  Surgeon: [name], DPM;  Location: [place];  Service: Podiatry;  Laterality: Left;  . PUBOVAGINAL SLING N/A 10/11/2017   Procedure: Gaynelle Arabian;  Surgeon: Bjorn Loser, MD;  Location: [facility] ORS;  Service: Urology;  Laterality: N/A;  . REPLACEMENT TOTAL  KNEE     left  . right shoulder surgery    . TARSAL TUNNEL RELEASE Left 11/23/2016   Procedure: TARSAL TUNNEL RELEASE;  Surgeon: [name], DPM;  Location: [place];  Service: Podiatry;  Laterality: Left;  POPLITEAL    Prior to Admission medications   Medication Sig Start Date End Date Taking? Authorizing Provider  acetaminophen (TYLENOL) 500 MG tablet Take 1,000 mg by mouth every 8 (eight) hours as needed for mild pain or headache.    [provider]  albuterol (PROVENTIL HFA;VENTOLIN HFA) 108 (90 Base) MCG/ACT inhaler Inhale 2 puffs into the lungs every 6 (six) hours as needed for wheezing or shortness of breath.    [provider]  atorvastatin (LIPITOR) 20 MG tablet Take 20 mg by mouth daily.    [provider]  Biotin 5000 MCG CAPS Take 1 capsule (5,000 mcg total) by mouth daily. 11/23/16   [name], DPM  Cholecalciferol (VITAMIN D3) 2000 units capsule Take 1 capsule (2,000 Units total) by mouth daily. Patient taking differently: Take 2,000 Units by mouth daily.  03/03/16   Milinda Pointer, MD  fluticasone (FLONASE) 50 MCG/ACT nasal spray Place 1 spray into both nostrils daily as needed for allergies or rhinitis.     [provider]  lisinopril (PRINIVIL,ZESTRIL) 10 MG tablet Take 10 mg by mouth daily.    [provider]  metoprolol tartrate (LOPRESSOR) 50 MG tablet Take 25 mg by mouth 2 (two) times daily.    [provider]  montelukast (SINGULAIR) 10 MG tablet Take 10 mg by mouth at bedtime.    [provider]  Multiple Vitamins-Minerals (PRESERVISION AREDS PO) Take 1 capsule by mouth 2 (two) times daily.     [provider]  sertraline (ZOLOFT) 50 MG tablet Take 50 mg by mouth daily.    [provider]    Allergies Ciprofloxacin; Morphine and related; Cephalexin; and Sulfa antibiotics  Family History  Problem Relation Age of Onset  . Cancer Mother   . Heart disease Father   .  Heart disease Brother   . Breast cancer Neg Hx   . Bladder Cancer Neg Hx   . Kidney cancer Neg Hx     Social History Social History   Tobacco Use  . Smoking status: Former Smoker    Last attempt to quit: 2005    Years since quitting: 15.1  . Smokeless tobacco: Never Used  Substance Use Topics  . Alcohol use: No    Alcohol/week: 0.0 standard drinks  . Drug use: No    Review of Systems Constitutional: No fever/chills Eyes: No visual changes. ENT: No sore throat. Cardiovascular: Denies chest pain. Respiratory: Denies shortness of breath. Gastrointestinal: No abdominal pain.  No nausea, no vomiting.  No diarrhea.  No constipation. Genitourinary: Negative for dysuria. Musculoskeletal: Negative for back pain. Skin: Negative for rash.  Index finger laceration. Neurological: Negative for headaches, focal weakness or numbness. Endocrine:  Hyperlipidemia hypertension. Allergic/Immunilogical: See allergy  list. ____________________________________________   PHYSICAL EXAM:  VITAL SIGNS: ED Triage Vitals  Enc Vitals Group     BP 02/06/19 0743 (!) 139/110     Pulse Rate 02/06/19 0743 72     Resp 02/06/19 0743 12     Temp 02/06/19 0743 97.9 F (36.6 C)     Temp Source 02/06/19 0743 Oral     SpO2 02/06/19 0743 97 %     Weight 02/06/19 0744 139 lb (63 kg)     Height 02/06/19 0744 5\' 1"  (1.549 m)     Head Circumference --      Peak Flow --      Pain Score 02/06/19 0744 8     Pain Loc --      Pain Edu? --      Excl. in [place]? --     Constitutional: Alert and oriented. Well appearing and in no acute distress. Cardiovascular: Normal rate, regular rhythm. Grossly normal heart sounds.  Good peripheral circulation. Respiratory: Normal respiratory effort.  No retractions. Lungs CTAB. Neurologic:  Normal speech and language. No gross focal neurologic deficits are appreciated. No gait instability. Skin: 0.5 cm laceration palmar aspect of the second digit left hand. Psychiatric: Mood  and affect are normal. Speech and behavior are normal.  ____________________________________________   LABS (all labs ordered are listed, but only abnormal results are displayed)  Labs Reviewed - No data to display ____________________________________________  EKG   ____________________________________________  RADIOLOGY  ED MD interpretation:    Official radiology report(s): No results found.  ____________________________________________   PROCEDURES  Procedure(s) performed: None  .Marland KitchenLaceration Repair Date/Time: 02/06/2019 9:42 AM Performed by: Sable Feil, PA-C Authorized by: Sable Feil, PA-C   Consent:    Consent obtained:  Verbal   Consent given by:  Parent   Risks discussed:  Infection and pain Anesthesia (see MAR for exact dosages):    Anesthesia method:  Topical application Laceration details:    Location:  Finger   Finger location:  L index finger   Length (cm):  0.5 Repair type:    Repair type:  Simple Pre-procedure details:    Preparation:  Patient was prepped and draped in usual sterile fashion Treatment:    Area cleansed with:  Betadine and saline Skin repair:    Repair method:  Tissue adhesive Approximation:    Approximation:  Close Post-procedure details:    Dressing:  Adhesive bandage   Patient tolerance of procedure:  Tolerated well, no immediate complications    Critical Care performed: No  ____________________________________________   INITIAL IMPRESSION / ASSESSMENT AND PLAN / ED COURSE  As part of my medical decision making, I reviewed the following data within the electronic MEDICAL RECORD NUMBER     Left index finger laceration.  Area was surgical clean and closed with Dermabond.  Patient given discharge care instruction.  Patient given a tetanus shot prior to departure.  Patient advised follow-up in this department if wound reopens for healing is complete.      ____________________________________________   FINAL  CLINICAL IMPRESSION(S) / ED DIAGNOSES  Final diagnoses:  Laceration of left index finger without foreign body without damage to nail, initial encounter     ED Discharge Orders    None       Note:  This document was prepared using Dragon voice recognition software and may include unintentional dictation errors.    Sable Feil, PA-C 02/06/19 4034    Earleen Newport, MD 02/06/19 1114

## 2019-02-06 NOTE — ED Triage Notes (Signed)
Patient has laceration to left index finger proximal to fingertip on the thumb side.  Bleeding small amount, dressing in place.  Patient she cut her finger with a knife at 1830 last PM.  Takes a baby ASA daily.

## 2019-07-26 ENCOUNTER — Other Ambulatory Visit: Payer: Self-pay | Admitting: Internal Medicine

## 2019-07-26 DIAGNOSIS — Z1231 Encounter for screening mammogram for malignant neoplasm of breast: Secondary | ICD-10-CM

## 2019-08-07 ENCOUNTER — Other Ambulatory Visit: Payer: Self-pay | Admitting: Physical Medicine and Rehabilitation

## 2019-08-07 DIAGNOSIS — M5416 Radiculopathy, lumbar region: Secondary | ICD-10-CM

## 2019-08-14 ENCOUNTER — Ambulatory Visit
Admission: RE | Admit: 2019-08-14 | Discharge: 2019-08-14 | Disposition: A | Discharge status: Home / Self Care | Payer: Medicare Other | Source: Ambulatory Visit | Attending: Physical Medicine and Rehabilitation | Admitting: Physical Medicine and Rehabilitation

## 2019-08-14 ENCOUNTER — Other Ambulatory Visit: Payer: Self-pay

## 2019-08-14 DIAGNOSIS — M5416 Radiculopathy, lumbar region: Secondary | ICD-10-CM | POA: Diagnosis not present

## 2019-08-29 ENCOUNTER — Ambulatory Visit
Admission: RE | Admit: 2019-08-29 | Discharge: 2019-08-29 | Disposition: A | Discharge status: Home / Self Care | Payer: Medicare Other | Source: Ambulatory Visit | Attending: Internal Medicine | Admitting: Internal Medicine

## 2019-08-29 DIAGNOSIS — Z1231 Encounter for screening mammogram for malignant neoplasm of breast: Secondary | ICD-10-CM | POA: Insufficient documentation

## 2020-07-23 ENCOUNTER — Ambulatory Visit
Admission: RE | Admit: 2020-07-23 | Discharge: 2020-07-23 | Disposition: A | Discharge status: Home / Self Care | Payer: Medicare Other | Source: Ambulatory Visit | Attending: Student in an Organized Health Care Education/Training Program | Admitting: Student in an Organized Health Care Education/Training Program

## 2020-07-23 ENCOUNTER — Encounter: Payer: Self-pay | Admitting: Student in an Organized Health Care Education/Training Program

## 2020-07-23 ENCOUNTER — Other Ambulatory Visit: Payer: Self-pay

## 2020-07-23 ENCOUNTER — Ambulatory Visit: Payer: Medicare Other | Admitting: Student in an Organized Health Care Education/Training Program

## 2020-07-23 VITALS — BP 151/65 | HR 76 | Temp 97.6°F | Resp 16 | Ht 61.0 in | Wt 124.0 lb

## 2020-07-23 DIAGNOSIS — M5136 Other intervertebral disc degeneration, lumbar region: Secondary | ICD-10-CM

## 2020-07-23 DIAGNOSIS — M5126 Other intervertebral disc displacement, lumbar region: Secondary | ICD-10-CM | POA: Insufficient documentation

## 2020-07-23 DIAGNOSIS — G8929 Other chronic pain: Secondary | ICD-10-CM | POA: Diagnosis present

## 2020-07-23 DIAGNOSIS — M5124 Other intervertebral disc displacement, thoracic region: Secondary | ICD-10-CM | POA: Diagnosis not present

## 2020-07-23 DIAGNOSIS — M5442 Lumbago with sciatica, left side: Secondary | ICD-10-CM

## 2020-07-23 DIAGNOSIS — M4726 Other spondylosis with radiculopathy, lumbar region: Secondary | ICD-10-CM | POA: Diagnosis present

## 2020-07-23 DIAGNOSIS — M5441 Lumbago with sciatica, right side: Secondary | ICD-10-CM

## 2020-07-23 DIAGNOSIS — G894 Chronic pain syndrome: Secondary | ICD-10-CM | POA: Insufficient documentation

## 2020-07-23 DIAGNOSIS — M47816 Spondylosis without myelopathy or radiculopathy, lumbar region: Secondary | ICD-10-CM | POA: Insufficient documentation

## 2020-07-23 NOTE — Progress Notes (Signed)
Patient: Tina Frey  Service Category: E/M  Provider: Gillis Santa, MD  DOB: [birth year]  DOS: 07/23/2020  Referring Provider: Kirk Ruths, MD  MRN: [id number]  Setting: Ambulatory outpatient  PCP: Kirk Ruths, MD  Type: New Patient  Specialty: Interventional Pain Management    Location: Office  Delivery: Face-to-face     Primary Reason(s) for Visit: Encounter for initial evaluation of one or more chronic problems (new to examiner) potentially causing chronic pain, and posing a threat to normal musculoskeletal function. (Level of risk: High) CC: Back Pain (mid to lower)  HPI  [name] is a 79 y.o. year old, female patient, who comes today to see [place] for the first time for an initial evaluation of her chronic pain. 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 Today she comes in for evaluation of her Back Pain (mid to lower)  Pain Assessment: Location: Left, Mid, Lower Back Radiating: through left hip down left leg to  include all left toes Onset: More than a month ago Duration: Chronic pain Quality: Aching, Burning, Shooting, Sharp, Tingling, Numbness Severity: 8 /10 (subjective, self-reported pain score)  Effect on ADL: works through pain Timing: Constant Modifying factors: tylenol with ibuprofen help "somewhat" BP: (!) 151/65   HR: 76  Onset and Duration: Present longer than 3 months Cause of pain: Unknown Severity: No change since onset and NAS-11 on the average: 7/10 Timing: Not influenced by the time of the day Aggravating Factors: Bending, Kneeling, Lifiting, Motion, Prolonged sitting, Prolonged standing, Twisting and Walking Alleviating Factors: Medications Associated Problems: Pain that wakes patient up and Pain that does not allow patient to sleep Quality of Pain: Constant, Deep, Sharp and Shooting Previous Examinations or Tests: The patient denies no exams or tests Previous Treatments: Epidural steroid injections, Narcotic medications and TENS  The patient comes into the clinics today for the first time for a chronic pain management evaluation.   Tina Frey is a 79 year old female who is very pleasant who presents with a chief complaint of low back pain with radiation into her left lateral leg in a dermatomal fashion.  She has a history of peripheral neuropathy, lumbar polyradiculopathy.  She endorses numbness and tingling down the left lateral portion of her thigh down her leg into her toes.  This has been going on for many years.  She has tried various injections which are detailed below which were not very effective including facet joint injections as well as transforaminal epidural steroid injections left L5-S1.  She is being referred here from physical medicine and rehab to discuss spinal cord stimulation and gather more information on this.  Meds   Current Outpatient Medications:    acetaminophen (TYLENOL) 500 MG tablet, Take 1,000 mg by mouth every 8 (  eight) hours as needed for mild pain or  headache., Disp: , Rfl:    albuterol (PROVENTIL HFA;VENTOLIN HFA) 108 (90 Base) MCG/ACT inhaler, Inhale 2 puffs into the lungs every 6 (six) hours as needed for wheezing or shortness of breath., Disp: , Rfl:    aspirin EC 81 MG tablet, Take 81 mg by mouth daily. Swallow whole., Disp: , Rfl:    atorvastatin (LIPITOR) 20 MG tablet, Take 20 mg by mouth daily., Disp: , Rfl:    Biotin 5000 MCG CAPS, Take 1 capsule (5,000 mcg total) by mouth daily., Disp: 30 capsule, Rfl:    Cholecalciferol (VITAMIN D3) 2000 units capsule, Take 1 capsule (2,000 Units total) by mouth daily. (Patient taking differently: Take 2,000 Units by mouth daily. ), Disp: 30 capsule, Rfl: PRN   fluticasone (FLONASE) 50 MCG/ACT nasal spray, Place 1 spray into both nostrils daily as needed for allergies or rhinitis. , Disp: , Rfl:    ibuprofen (ADVIL) 200 MG tablet, Take 400 mg by mouth every 8 (eight) hours as needed., Disp: , Rfl:    lisinopril (PRINIVIL,ZESTRIL) 10 MG tablet, Take 10 mg by mouth daily., Disp: , Rfl:    metoprolol tartrate (LOPRESSOR) 50 MG tablet, Take 25 mg by mouth 2 (two) times daily., Disp: , Rfl:    montelukast (SINGULAIR) 10 MG tablet, Take 10 mg by mouth at bedtime., Disp: , Rfl:    Multiple Vitamins-Minerals (PRESERVISION AREDS PO), Take 1 capsule by mouth 2 (two) times daily. , Disp: , Rfl:    sertraline (ZOLOFT) 50 MG tablet, Take 50 mg by mouth daily. (Patient not taking: Reported on 07/23/2020), Disp: , Rfl:   Imaging Review    Lumbosacral Imaging: Lumbar MR wo contrast: Results for orders placed during the hospital encounter of 08/14/19  MR LUMBAR SPINE WO CONTRAST  Narrative CLINICAL DATA:  Low back pain radiating into the left leg to the foot with left foot numbness. Symptoms are chronic but have worsened over the past 6-8 months. No known injury.  EXAM: MRI LUMBAR SPINE WITHOUT CONTRAST  TECHNIQUE: Multiplanar, multisequence MR imaging of the lumbar spine was performed. No  intravenous contrast was administered.  COMPARISON:  MRI lumbar spine 10/29/2015.  FINDINGS: Segmentation:  Standard.  Alignment: Convex left scoliosis with the apex at approximately L2 is again seen. 0.4 cm anterolisthesis L5 on S1 due to facet degenerative disease is also unchanged.  Vertebrae: No fracture or worrisome lesion. Degenerative endplate signal change eccentric to the right at L2-3 noted.  Conus medullaris and cauda equina: Conus extends to the L1-2 level. Conus and cauda equina appear normal.  Paraspinal and other soft tissues: Small, chronic left adrenal lesion is unchanged.  Disc levels:  T11-12 is imaged in the sagittal plane only. Minimal disc bulge without stenosis.  T12-L1: Shallow disc bulge without stenosis, unchanged.  L1-2: Mild to moderate facet degenerative change. Shallow disc bulge with a superimposed small right paracentral protrusion with slight cephalad extension. The protrusion is smaller than on the prior exam. The central canal and foramina are widely patent.  L2-3: Mild-to-moderate facet degenerative disease and a shallow disc bulge. No stenosis.  L3-4: Mild disc bulge and mild-to-moderate facet degenerative change. No stenosis or change.  L4-5: Left worse than right facet degenerative disease is unchanged. Shallow disc bulge with a small central disc protrusion with cephalad extension. The protrusion is slightly larger than on the prior examination but there is no stenosis.  L5-S1: Slight disc uncovering and bulging. Facet degenerative disease. No stenosis.  IMPRESSION: Negative for central canal or foraminal narrowing at any level. Small central protrusion with cephalad extension at L4-5 is slightly larger than on the prior examination. The appearance of the lumbar spine is otherwise stable.  Scattered facet degenerative disease appears worst on the left and L4-5 and unchanged.  Marked convex left  scoliosis.   Electronically Signed By: Inge Rise M.D. On: 08/14/2019 11:46    Foot-L DG Complete: Results for orders placed during the hospital encounter of 04/09/16  DG Foot Complete Left  Narrative CLINICAL DATA:  Pt to ED c/o left heel pain, reports pain radiates to toes and upward to knee, no injury; h/o bunion repair  EXAM: LEFT FOOT - COMPLETE 3+ VIEW  COMPARISON:  None.  FINDINGS: No fracture.  No dislocation.  Changes from bunion surgery with tube well healed first metatarsal osteotomy are noted. There is bony fusion across the PIP joint of the second toe.  The calcaneus is unremarkable.  No spurs.  New soft tissues are unremarkable.  IMPRESSION: No fracture or dislocation.   Electronically Signed By: Lajean Manes M.D. On: 04/09/2016 10:44   Complexity Note: Imaging results reviewed. Results shared with Ms. Arpin, using Layman's terms.                         ROS  Cardiovascular: Abnormal heart rhythm, Daily Aspirin intake and High blood pressure Pulmonary or Respiratory: No reported pulmonary signs or symptoms such as wheezing and difficulty taking a deep full breath (Asthma), difficulty blowing air out (Emphysema), coughing up mucus (Bronchitis), persistent dry cough, or temporary stoppage of breathing during sleep Neurological: Curved spine Psychological-Psychiatric: No reported psychological or psychiatric signs or symptoms such as difficulty sleeping, anxiety, depression, delusions or hallucinations (schizophrenial), mood swings (bipolar disorders) or suicidal ideations or attempts Gastrointestinal: No reported gastrointestinal signs or symptoms such as vomiting or evacuating blood, reflux, heartburn, alternating episodes of diarrhea and constipation, inflamed or scarred liver, or pancreas or irrregular and/or infrequent bowel movements Genitourinary: Passing kidney stones Hematological: Brusing easily Endocrine: No reported endocrine signs  or symptoms such as high or low blood sugar, rapid heart rate due to high thyroid levels, obesity or weight gain due to slow thyroid or thyroid disease Rheumatologic: Joint aches and or swelling due to excess weight (Osteoarthritis) Musculoskeletal: Negative for myasthenia gravis, muscular dystrophy, multiple sclerosis or malignant hyperthermia Work History: Retired  Allergies  Ms. Wertheim is allergic to ciprofloxacin, morphine and related, cephalexin, and sulfa antibiotics.  Laboratory Chemistry Profile   Renal Lab Results  Component Value Date   BUN 20 10/05/2017   CREATININE 0.65 10/05/2017   GFRAA >60 10/05/2017   GFRNONAA >60 10/05/2017   SPECGRAV 1.025 09/25/2017   PHUR 5.5 09/25/2017   PROTEINUR Negative 09/25/2017     Electrolytes Lab Results  Component Value Date   NA 139 10/05/2017   K 4.5 10/05/2017   CL 102 10/05/2017   CALCIUM 9.8 10/05/2017   MG 2.0 02/24/2016     Hepatic Lab Results  Component Value Date   AST 23 02/24/2016   ALT 17 02/24/2016   ALBUMIN 4.3 02/24/2016   ALKPHOS 65 02/24/2016     ID No results found for: LYMEIGGIGMAB, HIV, SARSCOV2NAA, STAPHAUREUS, MRSAPCR, HCVAB, PREGTESTUR, RMSFIGG, QFVRPH1IGG, QFVRPH2IGG, LYMEIGGIGMAB   Bone Lab Results  Component Value Date   VD25OH 24.9 (L) 02/24/2016   VD125OH2TOT 91.7 (H) 02/24/2016     Endocrine Lab Results  Component Value Date   GLUCOSE  88 10/05/2017   GLUCOSEU Negative 09/25/2017     Neuropathy Lab Results  Component Value Date   VITAMINB12 216 02/24/2016     CNS No results found for: COLORCSF, APPEARCSF, RBCCOUNTCSF, WBCCSF, POLYSCSF, LYMPHSCSF, EOSCSF, PROTEINCSF, GLUCCSF, JCVIRUS, CSFOLI, IGGCSF, LABACHR, ACETBL, LABACHR, ACETBL   Inflammation (CRP: Acute   ESR: Chronic) Lab Results  Component Value Date   CRP <0.5 02/24/2016   ESRSEDRATE 12 02/24/2016     Rheumatology No results found for: RF, ANA, LABURIC, URICUR, LYMEIGGIGMAB, LYMEABIGMQN, HLAB27   Coagulation Lab  Results  Component Value Date   PLT 212 10/05/2017     Cardiovascular Lab Results  Component Value Date   HGB 13.1 10/05/2017   HCT 39.0 10/05/2017     Screening No results found for: SARSCOV2NAA, COVIDSOURCE, STAPHAUREUS, MRSAPCR, HCVAB, HIV, PREGTESTUR   Cancer No results found for: CEA, CA125, LABCA2   Allergens No results found for: ALMOND, APPLE, ASPARAGUS, AVOCADO, BANANA, BARLEY, BASIL, BAYLEAF, GREENBEAN, LIMABEAN, WHITEBEAN, BEEFIGE, REDBEET, BLUEBERRY, BROCCOLI, CABBAGE, MELON, CARROT, CASEIN, CASHEWNUT, CAULIFLOWER, CELERY     Note: Lab results reviewed.  PFSH  Drug: Ms. Swartzentruber  reports no history of drug use. Alcohol:  reports no history of alcohol use. Tobacco:  reports that she quit smoking about 16 years ago. She has never used smokeless tobacco. Medical:  has a past medical history of Allergy, Arthritis, Arthritis, Cancer ([place]), Cataract, COPD (chronic obstructive pulmonary disease) ([place]), GERD (gastroesophageal reflux disease), Hyperlipidemia, Hypertension, Mitral valve prolapse, Scoliosis, and Wears dentures. Family: family history includes Cancer in her mother; Heart disease in her brother and father.  Past Surgical History:  Procedure Laterality Date   ABDOMINAL HYSTERECTOMY     APPENDECTOMY     ARTHROSCOPY KNEE W/ DRILLING     biopsy lip     BREAST EXCISIONAL BIOPSY Right 1982   NEG   BREAST SURGERY Right    biopsy /benigning   BUNIONECTOMY     right   CARDIAC CATHETERIZATION     CARDIOVASCULAR STRESS TEST     CARPAL TUNNEL RELEASE     left   CARPAL TUNNEL RELEASE     CATARACT EXTRACTION     left   CHOLECYSTECTOMY     [facility] joint repair     left   COLONOSCOPY WITH PROPOFOL N/A 07/19/2016   Procedure: COLONOSCOPY WITH PROPOFOL;  Surgeon: Lollie Sails, MD;  Location[facility] ENDOSCOPY;  Service: Endoscopy;  Laterality: N/A;   CYSTOSCOPY N/A 10/11/2017   Procedure: CYSTOSCOPY;  Surgeon: Bjorn Loser, MD;  Location: [facility] ORS;   Service: Urology;  Laterality: N/A;   EYE SURGERY     bilateral   HERNIA REPAIR     bilateral inguinal   JOINT REPLACEMENT Left    knee replacement   PLANTAR FASCIA RELEASE Left 11/23/2016   Procedure: ENDOSCOPIC PLANTAR FASCIA RELEASE;  Surgeon: [name], DPM;  Location: [place];  Service: Podiatry;  Laterality: Left;   PUBOVAGINAL SLING N/A 10/11/2017   Procedure: Gaynelle Arabian;  Surgeon: Bjorn Loser, MD;  Location: [facility] ORS;  Service: Urology;  Laterality: N/A;   REPLACEMENT TOTAL KNEE     left   right shoulder surgery     TARSAL TUNNEL RELEASE Left 11/23/2016   Procedure: TARSAL TUNNEL RELEASE;  Surgeon: [name], DPM;  Location: [place];  Service: Podiatry;  Laterality: Left;  POPLITEAL   Active Ambulatory Problems    Diagnosis Date Noted   Encounter for therapeutic drug level monitoring 10/12/2015   Long term  current use of opiate analgesic 10/12/2015   Long term prescription opiate use 50/35/4656   Uncomplicated opioid dependence ([place]) 10/12/2015   Opiate use (90 MME/Day) 10/12/2015   Lumbar facet syndrome (L>R) 10/12/2015   Lumbosacral radiculopathy (Left) (S1 Dermatome) 10/12/2015   Lumbar spondylosis 10/12/2015   Chronic low back pain 10/12/2015   Chronic pain 10/12/2015   3-vessel CAD 10/23/2014   Benign essential HTN 08/20/2015   Chronic obstructive pulmonary disease ([place]) 10/10/2014   Clinical depression 10/10/2014   Combined fat and carbohydrate induced hyperlipemia 11/16/2014   AI (aortic incompetence) 09/23/2015   MI (mitral incompetence) 11/10/2014   Awareness of heartbeats 03/05/2015   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 10/10/2014   H/O total knee replacement 10/16/2015   Levoscoliosis 11/30/2015   Lumbar facet hypertrophy (L2-3, L3-4, L4-L5, L5-S1) 11/30/2015   Thoracic disc herniation (T12-L1 disc protrusion) 11/30/2015   Lumbar disc bulging (L2-3, L3-4, and L4-5) 11/30/2015    Lumbar disc herniation  (Right) (L1-2: Small right-sided disc protrusion with upgoing disc material) 11/30/2015   Chronic lower extremity pain (Location of Primary Source of Pain) (S1 Dermatome) (Left) 02/24/2016   Vitamin D insufficiency 03/03/2016   Resolved Ambulatory Problems    Diagnosis Date Noted   No Resolved Ambulatory Problems   Past Medical History:  Diagnosis Date   Allergy    Arthritis    Arthritis    Cancer ([place])    Cataract    COPD (chronic obstructive pulmonary disease) ([place])    GERD (gastroesophageal reflux disease)    Hyperlipidemia    Hypertension    Mitral valve prolapse    Scoliosis    Wears dentures    Constitutional Exam  General appearance: Well nourished, well developed, and well hydrated. In no apparent acute distress Vitals:   07/23/20 0859  BP: (!) 151/65  Pulse: 76  Resp: 16  Temp: 97.6 F (36.4 C)  TempSrc: Temporal  SpO2: 96%  Weight: 124 lb (56.2 kg)  Height: '5\' 1"'  (1.549 m)   BMI Assessment: Estimated body mass index is 23.43 kg/m as calculated from the following:   Height as of this encounter: '5\' 1"'  (1.549 m).   Weight as of this encounter: 124 lb (56.2 kg).  BMI interpretation table: BMI level Category Range association with higher incidence of chronic pain  <18 kg/m2 Underweight   18.5-24.9 kg/m2 Ideal body weight   25-29.9 kg/m2 Overweight Increased incidence by 20%  30-34.9 kg/m2 Obese (Class I) Increased incidence by 68%  35-39.9 kg/m2 Severe obesity (Class II) Increased incidence by 136%  >40 kg/m2 Extreme obesity (Class III) Increased incidence by 254%   Patient's current BMI Ideal Body weight  Body mass index is 23.43 kg/m. Ideal body weight: 47.8 kg (105 lb 6.1 oz) Adjusted ideal body weight: 51.2 kg (112 lb 13.2 oz)   BMI Readings from Last 4 Encounters:  07/23/20 23.43 kg/m  02/06/19 26.26 kg/m  12/25/17 27.40 kg/m  10/16/17 25.89 kg/m   Wt Readings from Last 4 Encounters:  07/23/20 124 lb  (56.2 kg)  02/06/19 139 lb (63 kg)  12/25/17 145 lb (65.8 kg)  10/16/17 137 lb (62.1 kg)    Psych/Mental status: Alert, oriented x 3 (person, place, & time)       Eyes: PERLA Respiratory: No evidence of acute respiratory distress  Cervical Spine Exam  Skin & Axial Inspection: No masses, redness, edema, swelling, or associated skin lesions Alignment: Symmetrical Functional ROM: Unrestricted ROM      Stability: No instability detected Muscle Tone/Strength:  Functionally intact. No obvious neuro-muscular anomalies detected. Sensory (Neurological): Unimpaired Palpation: No palpable anomalies              Upper Extremity (UE) Exam    Side: Right upper extremity  Side: Left upper extremity  Skin & Extremity Inspection: Skin color, temperature, and hair growth are WNL. No peripheral edema or cyanosis. No masses, redness, swelling, asymmetry, or associated skin lesions. No contractures.  Skin & Extremity Inspection: Skin color, temperature, and hair growth are WNL. No peripheral edema or cyanosis. No masses, redness, swelling, asymmetry, or associated skin lesions. No contractures.  Functional ROM: Unrestricted ROM          Functional ROM: Unrestricted ROM          Muscle Tone/Strength: Functionally intact. No obvious neuro-muscular anomalies detected.   Muscle Tone/Strength: Functionally intact. No obvious neuro-muscular anomalies detected.  Sensory (Neurological): Unimpaired          Sensory (Neurological): Unimpaired          Palpation: No palpable anomalies              Palpation: No palpable anomalies              Provocative Test(s):  Phalen's test: deferred Tinel's test: deferred Apley's scratch test (touch opposite shoulder):  Action 1 (Across chest): deferred Action 2 (Overhead): deferred Action 3 (LB reach): deferred   Provocative Test(s):  Phalen's test: deferred Tinel's test: deferred Apley's scratch test (touch opposite shoulder):  Action 1 (Across chest): deferred Action 2  (Overhead): deferred Action 3 (LB reach): deferred    Thoracic Spine Area Exam  Skin & Axial Inspection: No masses, redness, or swelling Alignment: Symmetrical Functional ROM: Decreased ROM Stability: No instability detected Muscle Tone/Strength: Functionally intact. No obvious neuro-muscular anomalies detected. Sensory (Neurological): Unimpaired Muscle strength & Tone: No palpable anomalies  Lumbar Exam  Skin & Axial Inspection: Thoraco-lumbar Scoliosis Alignment: Levoscoliosis Functional ROM: Decreased ROM affecting both sides Stability: No instability detected Muscle Tone/Strength: Functionally intact. No obvious neuro-muscular anomalies detected. Sensory (Neurological): Musculoskeletal pain pattern and dermatomal on the left Palpation: No palpable anomalies       Provocative Tests: Hyperextension/rotation test: (+) bilaterally for facet joint pain. Lumbar quadrant test (Kemp's test): (+) bilaterally for facet joint pain. Lateral bending test: deferred today       Patrick's Maneuver: deferred today                   FABER* test: deferred today                   S-I anterior distraction/compression test: deferred today         S-I lateral compression test: deferred today         S-I Thigh-thrust test: deferred today         S-I Gaenslen's test: deferred today         *(Flexion, ABduction and External Rotation)  Gait & Posture Assessment  Ambulation: Unassisted Gait: Antalgic Posture: WNL   Lower Extremity Exam    Side: Right lower extremity  Side: Left lower extremity  Stability: No instability observed          Stability: No instability observed          Skin & Extremity Inspection: Skin color, temperature, and hair growth are WNL. No peripheral edema or cyanosis. No masses, redness, swelling, asymmetry, or associated skin lesions. No contractures.  Skin & Extremity Inspection: Skin color, temperature, and hair growth are WNL.  No peripheral edema or cyanosis. No masses,  redness, swelling, asymmetry, or associated skin lesions. No contractures.  Functional ROM: Unrestricted ROM                  Functional ROM: Pain restricted ROM for hip and knee joints          Muscle Tone/Strength: Functionally intact. No obvious neuro-muscular anomalies detected.  Muscle Tone/Strength: Functionally intact. No obvious neuro-muscular anomalies detected.  Sensory (Neurological): Unimpaired        Sensory (Neurological): Dermatomal pain pattern        DTR: Patellar: deferred today Achilles: deferred today Plantar: deferred today  DTR: Patellar: deferred today Achilles: deferred today Plantar: deferred today  Palpation: No palpable anomalies  Palpation: No palpable anomalies   Assessment  Primary Diagnosis & Pertinent Problem List: The primary encounter diagnosis was Chronic bilateral low back pain with bilateral sciatica. Diagnoses of Lumbar spondylosis, Lumbar facet syndrome (L>R), Lumbar facet hypertrophy (L2-3, L3-4, L4-L5, L5-S1), Thoracic disc herniation (T12-L1 disc protrusion), Lumbar disc bulging (L2-3, L3-4, and L4-5), Lumbar disc herniation  (Right) (L1-2: Small right-sided disc protrusion with upgoing disc material), and Chronic pain syndrome were also pertinent to this visit.  Visit Diagnosis (New problems to examiner): 1. Chronic bilateral low back pain with bilateral sciatica   2. Lumbar spondylosis   3. Lumbar facet syndrome (L>R)   4. Lumbar facet hypertrophy (L2-3, L3-4, L4-L5, L5-S1)   5. Thoracic disc herniation (T12-L1 disc protrusion)   6. Lumbar disc bulging (L2-3, L3-4, and L4-5)   7. Lumbar disc herniation  (Right) (L1-2: Small right-sided disc protrusion with upgoing disc material)   8. Chronic pain syndrome    Plan of Care (Initial workup plan)   I had an extensive discussion with the patient regarding various stimulation based therapies for her pain condition that is multifactorial related to lumbar facet arthropathy, lumbar disc herniation,  sensory polyneuropathy..  Whereas the patient's pain is both axial and appendicular.  She also has axial low back pain that is related to facet arthropathy and degeneration as evidenced on her most recent lumbar MRI.  I had a chance to evaluate the patient's thoracic interlaminar windows.  Patient has a significant degree of thoracolumbar scoliosis which would make it very difficult for a percutaneous spinal cord stimulator trial.  This increases the patient's risk of spinal cord injury especially with her degree of scoliosis and I do not recommend a thoracolumbar spinal cord stimulator trial given her alignment angle in her thoracolumbar region.  We did however discuss sprint peripheral nerve stimulation of lumbar medial branch which could be an effective treatment modality for her low back pain related to lumbar facet arthropathy, lumbar degenerative disc disease, lumbar spondylosis.  Risks and benefits of this procedure were discussed in great detail including how electrode lead is in place for 60 days to help restructure pain transmission and transduction and higher order pain processing pathways.   Plan for left medial branch Sprint peripheral nerve stimulation therapy in 1 to 2 weeks followed by the right 2 weeks after.   Imaging Orders     DG PAIN CLINIC C-ARM 1-60 MIN NO REPORT  Procedure Orders     Peripheral Nerve Stimulation     Peripheral Nerve Stimulation   Provider-requested follow-up: Return in about 2 weeks (around 08/06/2020) for LEFT SPRINT PNS (PO Valium).  No future appointments.  Note by: Gillis Santa, MD Date: 07/23/2020; Time: 10:55 AM

## 2020-07-23 NOTE — Patient Instructions (Signed)
Information given for SPRINT PNS.

## 2020-07-23 NOTE — Progress Notes (Signed)
Safety precautions to be maintained throughout the outpatient stay will include: orient to surroundings, keep bed in low position, maintain call bell within reach at all times, provide assistance with transfer out of bed and ambulation.  

## 2020-07-29 ENCOUNTER — Other Ambulatory Visit: Payer: Self-pay | Admitting: Internal Medicine

## 2020-07-29 DIAGNOSIS — Z1231 Encounter for screening mammogram for malignant neoplasm of breast: Secondary | ICD-10-CM

## 2020-08-12 ENCOUNTER — Other Ambulatory Visit: Payer: Self-pay | Admitting: Student

## 2020-08-12 DIAGNOSIS — M25561 Pain in right knee: Secondary | ICD-10-CM

## 2020-08-12 DIAGNOSIS — M2400 Loose body in unspecified joint: Secondary | ICD-10-CM

## 2020-08-26 ENCOUNTER — Ambulatory Visit
Admission: RE | Admit: 2020-08-26 | Discharge: 2020-08-26 | Disposition: A | Discharge status: Home / Self Care | Payer: Medicare Other | Source: Ambulatory Visit | Attending: Student in an Organized Health Care Education/Training Program | Admitting: Student in an Organized Health Care Education/Training Program

## 2020-08-26 ENCOUNTER — Ambulatory Visit (HOSPITAL_BASED_OUTPATIENT_CLINIC_OR_DEPARTMENT_OTHER): Payer: Medicare Other | Admitting: Student in an Organized Health Care Education/Training Program

## 2020-08-26 ENCOUNTER — Other Ambulatory Visit: Payer: Self-pay

## 2020-08-26 ENCOUNTER — Encounter: Payer: Self-pay | Admitting: Student in an Organized Health Care Education/Training Program

## 2020-08-26 DIAGNOSIS — M4726 Other spondylosis with radiculopathy, lumbar region: Secondary | ICD-10-CM

## 2020-08-26 DIAGNOSIS — M5441 Lumbago with sciatica, right side: Secondary | ICD-10-CM | POA: Diagnosis not present

## 2020-08-26 DIAGNOSIS — M47816 Spondylosis without myelopathy or radiculopathy, lumbar region: Secondary | ICD-10-CM | POA: Insufficient documentation

## 2020-08-26 DIAGNOSIS — M5442 Lumbago with sciatica, left side: Secondary | ICD-10-CM

## 2020-08-26 DIAGNOSIS — G894 Chronic pain syndrome: Secondary | ICD-10-CM

## 2020-08-26 DIAGNOSIS — G8929 Other chronic pain: Secondary | ICD-10-CM | POA: Diagnosis not present

## 2020-08-26 MED ORDER — LIDOCAINE HCL 2 % IJ SOLN
INTRAMUSCULAR | Status: AC
  Filled 2020-08-26: qty 10

## 2020-08-26 MED ORDER — ROPIVACAINE HCL 2 MG/ML IJ SOLN
9.0000 mL | Freq: Once | INTRAMUSCULAR | Status: AC
  Administered 2020-08-26: 10 mL via PERINEURAL

## 2020-08-26 MED ORDER — LIDOCAINE HCL 2 % IJ SOLN
20.0000 mL | Freq: Once | INTRAMUSCULAR | Status: AC
  Administered 2020-08-26: 200 mg

## 2020-08-26 MED ORDER — ROPIVACAINE HCL 2 MG/ML IJ SOLN
INTRAMUSCULAR | Status: AC
  Filled 2020-08-26: qty 10

## 2020-08-26 NOTE — Progress Notes (Signed)
Safety precautions to be maintained throughout the outpatient stay will include: orient to surroundings, keep bed in low position, maintain call bell within reach at all times, provide assistance with transfer out of bed and ambulation.  

## 2020-08-26 NOTE — Progress Notes (Signed)
PROVIDER NOTE: Information contained herein reflects review and annotations entered in association with encounter. Interpretation of such information and data should be left to medically-trained personnel. Information provided to patient can be located elsewhere in the medical record under "Patient Instructions". Document created using STT-dictation technology, any transcriptional errors that may result from process are unintentional.    Patient: Tina Frey  Service Category: Procedure  Provider: Gillis Santa, MD  DOB: 25-Mar-1941  DOS: 08/26/2020  Location: [facility]  MRN: [id number]  Setting: Ambulatory - outpatient  Referring Provider: Gillis Santa, MD  Type: Established Patient  Specialty: Interventional Pain Management  PCP: Kirk Ruths, MD   Primary Reason for Visit: Interventional Pain Management Treatment. CC: Hip Pain (left)  Procedure:          Anesthesia, Analgesia, Anxiolysis:  Sprint peripheral nerve stimulation of left L4 lumbar medial branch for low back pain and left hip pain related to lumbar spondylosis, lumbar facet joint syndrome.  Type: Local Anesthesia  Local Anesthetic: Lidocaine 1-2%  Position: Prone   Indications: 1. Chronic bilateral low back pain with bilateral sciatica   2. Lumbar spondylosis   3. Lumbar facet syndrome (L>R)   4. Lumbar facet hypertrophy (L2-3, L3-4, L4-L5, L5-S1)   5. Chronic pain syndrome    Pain Score: Pre-procedure: 9 /10 Post-procedure: 9 /10   Pre-op Assessment:  Tina Frey is a 79 y.o. (year old), female patient, seen today for interventional treatment. She  has a past surgical history that includes Abdominal hysterectomy; Appendectomy; Cholecystectomy; Bunionectomy; cmc joint repair; Carpal tunnel release; Cataract extraction; Cardiac catheterization; biopsy lip; Arthroscopy knee w/ drilling; Replacement total knee; Carpal tunnel release; right shoulder surgery; Colonoscopy with propofol (N/A, 07/19/2016);  Cardiovascular stress test; Tarsal tunnel release (Left, 11/23/2016); Plantar fascia release (Left, 11/23/2016); Eye surgery; Breast surgery (Right); Hernia repair; Joint replacement (Left); Pubovaginal sling (N/A, 10/11/2017); Cystoscopy (N/A, 10/11/2017); and Breast excisional biopsy (Right, 1982). Tina Frey has a current medication list which includes the following prescription(s): acetaminophen, albuterol, aspirin ec, atorvastatin, biotin, vitamin d3, fluticasone, ibuprofen, lisinopril, metoprolol tartrate, montelukast, multiple vitamins-minerals, trazodone, and sertraline. Her primarily concern today is the Hip Pain (left)  Initial Vital Signs:  Pulse/HCG Rate: 71ECG Heart Rate: 81 Temp: 97.7 F (36.5 C) Resp: 18 BP: (!) 155/64 SpO2: 100 %  BMI: Estimated body mass index is 23.05 kg/m as calculated from the following:   Height as of this encounter: 5\' 2"  (1.575 m).   Weight as of this encounter: 126 lb (57.2 kg).  Risk Assessment: Allergies: Reviewed. She is allergic to ciprofloxacin, morphine and related, cephalexin, and sulfa antibiotics.  Allergy Precautions: None required Coagulopathies: Reviewed. None identified.  Blood-thinner therapy: None at this time Active Infection(s): Reviewed. None identified. Tina Frey is afebrile  Site Confirmation: Tina Frey was asked to confirm the procedure and laterality before marking the site Procedure checklist: Completed Consent: Before the procedure and under the influence of no sedative(s), amnesic(s), or anxiolytics, the patient was informed of the treatment options, risks and possible complications. To fulfill our ethical and legal obligations, as recommended by the American Medical Association's Code of Ethics, I have informed the patient of my clinical impression; the nature and purpose of the treatment or procedure; the risks, benefits, and possible complications of the intervention; the alternatives, including doing nothing; the risk(s)  and benefit(s) of the alternative treatment(s) or procedure(s); and the risk(s) and benefit(s) of doing nothing. The patient was provided information about the general risks and possible complications  associated with the procedure. These may include, but are not limited to: failure to achieve desired goals, infection, bleeding, organ or nerve damage, allergic reactions, paralysis, and death. In addition, the patient was informed of those risks and complications associated to Spine-related procedures, such as failure to decrease pain; infection (i.e.: Meningitis, epidural or intraspinal abscess); bleeding (i.e.: epidural hematoma, subarachnoid hemorrhage, or any other type of intraspinal or peri-dural bleeding); organ or nerve damage (i.e.: Any type of peripheral nerve, nerve root, or spinal cord injury) with subsequent damage to sensory, motor, and/or autonomic systems, resulting in permanent pain, numbness, and/or weakness of one or several areas of the body; allergic reactions; (i.e.: anaphylactic reaction); and/or death. Furthermore, the patient was informed of those risks and complications associated with the medications. These include, but are not limited to: allergic reactions (i.e.: anaphylactic or anaphylactoid reaction(s)); adrenal axis suppression; blood sugar elevation that in diabetics may result in ketoacidosis or comma; water retention that in patients with history of congestive heart failure may result in shortness of breath, pulmonary edema, and decompensation with resultant heart failure; weight gain; swelling or edema; medication-induced neural toxicity; particulate matter embolism and blood vessel occlusion with resultant organ, and/or nervous system infarction; and/or aseptic necrosis of one or more joints. Finally, the patient was informed that Medicine is not an exact science; therefore, there is also the possibility of unforeseen or unpredictable risks and/or possible complications that  may result in a catastrophic outcome. The patient indicated having understood very clearly. We have given the patient no guarantees and we have made no promises. Enough time was given to the patient to ask questions, all of which were answered to the patient's satisfaction. Ms. Rivkin has indicated that she wanted to continue with the procedure. Attestation: I, the ordering provider, attest that I have discussed with the patient the benefits, risks, side-effects, alternatives, likelihood of achieving goals, and potential problems during recovery for the procedure that I have provided informed consent. Date  Time: 08/26/2020 10:51 AM  Pre-Procedure Preparation:  Monitoring: As per clinic protocol. Respiration, ETCO2, SpO2, BP, heart rate and rhythm monitor placed and checked for adequate function Safety Precautions: Patient was assessed for positional comfort and pressure points before starting the procedure. Time-out: I initiated and conducted the "Time-out" before starting the procedure, as per protocol. The patient was asked to participate by confirming the accuracy of the "Time Out" information. Verification of the correct person, site, and procedure were performed and confirmed by me, the nursing staff, and the patient. "Time-out" conducted as per Joint Commission's Universal Protocol (UP.01.01.01). Time: 1150  Description of Procedure:          Laterality: Left lumbar L4 medial branch peripheral nerve stimulation  After the risks, benefits and alternatives were discussed  with the patient and informed consent was obtained, patient  was placed in the prone position and padded to foster comfort.  Prior to delivery of anesthetics, the painful region was carefully  outlined with a marker. Appropriate skin and bony landmarks  were identified, and pertinent vascular structures were located.  The skin overlying the lumbosacral spine was prepped and  draped in sterile fashion. Fluoroscopy was used to  identify the spinous process and lamina in the center of the  patient's region of pain. After identifying and marking the intended target along the course of the medial branch nerve,  the skin around the planned entry point and the subcutaneous tissues are injected with local anesthetic.  An introducer needle and stimulating probe  were  assembled, inserted and advanced along the intended course of the medial branch nerve as it traverses the lamina medial and inferior to the zygapophyseal joint, taking care to maintain the proper depth of insertion as the introducer is advanced under fluoroscopic. The introducer needle was delivered to a location in proximity to the nerve.  Multiple stimulation parameters were used to deliver stimulation to the medial branch nerve in concert with stimulating at multiple positions around the nerve. Nerve target  acquisition was confirmed noting generation of paresthesias in the paravertebral regions corresponding to the level being stimulated as well as rhythmic thumping within the multifidi, the  latter being further corroborated via palpation.   Various electrical parameter combinations were tested, and the lead location was adjusted (physically relocated) until the patient  indicated paresthesia or muscle tension overlapping the distribution of the patient's typical region of pain.   The stimulating probe was removed from the introducer and a percutaneous lead was guided through the needle and delivered to a location in similar proximity to the nerve. Final  location was verified with electrical stimulation and documented with fluoroscopy & ultrasound.   The introducer needle was removed, and the exposed end of the percutaneous lead was attached to an external stimulator unit. Various electrical parameter combinations were again  tested until the patient indicated paresthesia or muscle tension  overlapping the distribution of the patient's typical region of pain.     After confirming that lead impedance was in the normal range, the external unit was detached, the needle was removed, and the lead was anchored at the skin.  The lead was threaded into the connector block and electrical continuity and desired patient response was confirmed. The connector block was attached to the external  stimulator unit.The site was covered with a sterile occlusive dressing and  a fluoroscopic and ultrasound image was taken to document final placement. The patient was observed for stability of vital signs and comfort.     Illustration of the posterior view of the lumbar spine and the posterior neural structures. Laminae of L2 through S1 are labeled. DPRL5, dorsal primary ramus of L5; DPRS1, dorsal primary ramus of S1; DPR3, dorsal primary ramus of L3; FJ, facet (zygapophyseal) joint L3-L4; I, inferior articular process of L4; LB1, lateral branch of dorsal primary ramus of L1; IAB, inferior articular branches from L3 medial branch (supplies L4-L5 facet joint); IBP, intermediate branch plexus; MB3, medial branch of dorsal primary ramus of L3; NR3, third lumbar nerve root; S, superior articular process of L5; SAB, superior articular branches from L4 (supplies L4-5 facet joint also); TP3, transverse process of L3.  Vitals:   08/26/20 1055 08/26/20 1153 08/26/20 1158  BP: (!) 155/64 (!) 155/75 (!) 152/75  Pulse: 71    Resp: 18 20 18   Temp: 97.7 F (36.5 C)    SpO2: 100% 98% 97%  Weight: 126 lb (57.2 kg)    Height: 5\' 2"  (1.575 m)       Start Time: 1151 hrs. End Time: 1159 hrs.  Imaging Guidance (Spinal):          Type of Imaging Technique: Fluoroscopy Guidance (Spinal) and ultrasound guidance to confirm multifidus activation Indication(s): Assistance in needle guidance and placement for procedures requiring needle placement in or near specific anatomical locations not easily accessible without such assistance. Exposure Time: Please see nurses notes. Contrast: None  used. Fluoroscopic Guidance: I was personally present during the use of fluoroscopy. "Tunnel Vision Technique" used to obtain the best  possible view of the target area. Parallax error corrected before commencing the procedure. "Direction-depth-direction" technique used to introduce the needle under continuous pulsed fluoroscopy. Once target was reached, antero-posterior, oblique, and lateral fluoroscopic projection used confirm needle placement in all planes. Images permanently stored in EMR. Interpretation: No contrast injected. I personally interpreted the imaging intraoperatively. Adequate needle placement confirmed in multiple planes. Permanent images saved into the patient's record.  Antibiotic Prophylaxis:   Anti-infectives (From admission, onward)   None     Indication(s): None identified  Post-operative Assessment:  Post-procedure Vital Signs:  Pulse/HCG Rate: 7177 Temp: 97.7 F (36.5 C) Resp: 18 BP: (!) 152/75 SpO2: 97 %  EBL: None  Complications: No immediate post-treatment complications observed by team, or reported by patient.  Note: The patient tolerated the entire procedure well. A repeat set of vitals were taken after the procedure and the patient was kept under observation following institutional policy, for this type of procedure. Post-procedural neurological assessment was performed, showing return to baseline, prior to discharge. The patient was provided with post-procedure discharge instructions, including a section on how to identify potential problems. Should any problems arise concerning this procedure, the patient was given instructions to immediately contact us, at any time, without hesitation. In any case, we plan to contact the patient by telephone for a follow-up status report regarding this interventional procedure.  Comments:  No additional relevant information.  Plan of Care  Orders:  Orders Placed This Encounter  Procedures  . Peripheral Nerve  Stimulation    Standing Status:   Future    Standing Expiration Date:   08/26/2021    Scheduling Instructions:     RIGHT L4 PNS SPRINT in 2-2.5 weeks    Order Specific Question:   Where will this procedure be performed?    Answer:   [facility]  . Peripheral Nerve Stimulation    Standing Status:   Future    Standing Expiration Date:   08/26/2021    Scheduling Instructions:     RIGHT PNS Lumbar medial branch in 2 weeks    Order Specific Question:   Where will this procedure be performed?    Answer:   [facility]  . DG PAIN CLINIC C-ARM 1-60 MIN NO REPORT    Intraoperative interpretation by procedural physician at [facility].    Standing Status:   Standing    Number of Occurrences:   1    Order Specific Question:   Reason for exam:    Answer:   Assistance in needle guidance and placement for procedures requiring needle placement in or near specific anatomical locations not easily accessible without such assistance.    Medications ordered for procedure: Meds ordered this encounter  Medications  . lidocaine (XYLOCAINE) 2 % (with pres) injection 400 mg  . ropivacaine (PF) 2 mg/mL (0.2%) (NAROPIN) injection 9 mL   Medications administered: We administered lidocaine and ropivacaine (PF) 2 mg/mL (0.2%).  See the medical record for exact dosing, route, and time of administration.  Follow-up plan:   Return in about 2 weeks (around 09/09/2020) for RIGHT L4 PNS SPRINT .      Left L4 peripheral nerve stimulation with sprint on 08/26/2020, patient will return in 2 weeks for right side.   Recent Visits Date Type Provider Dept  07/23/20 Office Visit Gillis Santa, MD [facility]  Showing recent visits within past 90 days and meeting all other requirements Today's Visits Date Type Provider Dept  08/26/20 Procedure visit Brigid Vandekamp,  Carlus Pavlov, MD [facility]  Showing today's visits and meeting all other requirements Future Appointments No visits were found  meeting these conditions. Showing future appointments within next 90 days and meeting all other requirements  Disposition: Discharge home  Discharge (Date  Time): 08/26/2020; 1215 hrs.   Primary Care Physician: Kirk Ruths, MD Location: [facility] Note by: Gillis Santa, MD Date: 08/26/2020; Time: 12:48 PM  Disclaimer:  Medicine is not an exact science. The only guarantee in medicine is that nothing is guaranteed. It is important to note that the decision to proceed with this intervention was based on the information collected from the patient. The Data and conclusions were drawn from the patient's questionnaire, the interview, and the physical examination. Because the information was provided in large part by the patient, it cannot be guaranteed that it has not been purposely or unconsciously manipulated. Every effort has been made to obtain as much relevant data as possible for this evaluation. It is important to note that the conclusions that lead to this procedure are derived in large part from the available data. Always take into account that the treatment will also be dependent on availability of resources and existing treatment guidelines, considered by other Pain Management Practitioners as being common knowledge and practice, at the time of the intervention. For Medico-Legal purposes, it is also important to point out that variation in procedural techniques and pharmacological choices are the acceptable norm. The indications, contraindications, technique, and results of the above procedure should only be interpreted and judged by a Board-Certified Interventional Pain Specialist with extensive familiarity and expertise in the same exact procedure and technique.

## 2020-08-27 ENCOUNTER — Telehealth: Payer: Self-pay | Admitting: *Deleted

## 2020-08-27 NOTE — Telephone Encounter (Signed)
No problems post procedure. 

## 2020-08-29 ENCOUNTER — Ambulatory Visit
Admission: RE | Admit: 2020-08-29 | Discharge: 2020-08-29 | Disposition: A | Discharge status: Home / Self Care | Payer: Medicare Other | Source: Ambulatory Visit | Attending: Student | Admitting: Student

## 2020-08-29 ENCOUNTER — Other Ambulatory Visit: Payer: Self-pay

## 2020-08-29 DIAGNOSIS — M2400 Loose body in unspecified joint: Secondary | ICD-10-CM | POA: Diagnosis present

## 2020-08-29 DIAGNOSIS — M25561 Pain in right knee: Secondary | ICD-10-CM | POA: Diagnosis not present

## 2020-08-31 ENCOUNTER — Ambulatory Visit
Admission: RE | Admit: 2020-08-31 | Discharge: 2020-08-31 | Disposition: A | Discharge status: Home / Self Care | Payer: Medicare Other | Source: Ambulatory Visit | Attending: Internal Medicine | Admitting: Internal Medicine

## 2020-08-31 ENCOUNTER — Other Ambulatory Visit: Payer: Self-pay

## 2020-08-31 DIAGNOSIS — Z1231 Encounter for screening mammogram for malignant neoplasm of breast: Secondary | ICD-10-CM | POA: Diagnosis not present

## 2020-09-04 ENCOUNTER — Other Ambulatory Visit: Payer: Self-pay | Admitting: Surgery

## 2020-09-07 ENCOUNTER — Encounter
Admission: RE | Admit: 2020-09-07 | Discharge: 2020-09-07 | Disposition: A | Discharge status: Home / Self Care | Payer: Medicare Other | Source: Ambulatory Visit | Attending: Surgery | Admitting: Surgery

## 2020-09-07 ENCOUNTER — Other Ambulatory Visit: Payer: Self-pay

## 2020-09-07 DIAGNOSIS — Z20822 Contact with and (suspected) exposure to covid-19: Secondary | ICD-10-CM | POA: Diagnosis not present

## 2020-09-07 DIAGNOSIS — I341 Nonrheumatic mitral (valve) prolapse: Secondary | ICD-10-CM | POA: Insufficient documentation

## 2020-09-07 DIAGNOSIS — Z01812 Encounter for preprocedural laboratory examination: Secondary | ICD-10-CM | POA: Diagnosis present

## 2020-09-07 DIAGNOSIS — J449 Chronic obstructive pulmonary disease, unspecified: Secondary | ICD-10-CM | POA: Diagnosis not present

## 2020-09-07 DIAGNOSIS — I10 Essential (primary) hypertension: Secondary | ICD-10-CM | POA: Insufficient documentation

## 2020-09-07 DIAGNOSIS — Z01818 Encounter for other preprocedural examination: Secondary | ICD-10-CM | POA: Diagnosis not present

## 2020-09-07 DIAGNOSIS — Z01810 Encounter for preprocedural cardiovascular examination: Secondary | ICD-10-CM | POA: Diagnosis present

## 2020-09-07 NOTE — Patient Instructions (Addendum)
Your procedure is scheduled on: 09-09-20 [facility] Report to Same Day Surgery 2nd floor medical mall [facility] Entrance-take elevator on left to 2nd floor.  Check in with surgery information desk.) To find out your arrival time please call [phone] between 1PM - 3PM on 09-08-20 TUESDAY  Remember: Instructions that are not followed completely may result in serious medical risk, up to and including death, or upon the discretion of your surgeon and anesthesiologist your surgery may need to be rescheduled.    _x___ 1. Do not eat food after midnight the night before your procedure. NO GUM OR CANDY AFTER MIDNIGHT. You may drink clear liquids up to 2 hours before you are scheduled to arrive at the hospital for your procedure.  Do not drink clear liquids within 2 hours of your scheduled arrival to the hospital.  Clear liquids include  --Water or Apple juice without pulp  --Gatorade  --Black Coffee or Clear Tea (No milk, no creamers, do not add anything to the coffee or Tea-OK TO ADD SUGAR)  _X___Ensure clear carbohydrate drink-FINISH DRINK 2 HOURS PRIOR TO ARRIVAL TIME TO HOSPITAL THE DAY OF SURGERY    __x__ 2. No Alcohol for 24 hours before or after surgery.   __x__3. No Smoking or e-cigarettes for 24 prior to surgery.  Do not use any chewable tobacco products for at least 6 hour prior to surgery   ____  4. Bring all medications with you on the day of surgery if instructed.    __x__ 5. Notify your doctor if there is any change in your medical condition     (cold, fever, infections).    x___6. On the morning of surgery brush your teeth with toothpaste and water.  You may rinse your mouth with mouth wash if you wish.  Do not swallow any toothpaste or mouthwash.   Do not wear jewelry, make-up, hairpins, clips or nail polish.  Do not wear lotions, powders, or perfumes.   Do not shave 48 hours prior to surgery. Men may shave face and neck.  Do not bring valuables to the hospital.    [facility] is not responsible for any belongings or valuables.               Contacts, dentures or bridgework may not be worn into surgery.  Leave your suitcase in the car. After surgery it may be brought to your room.  For patients admitted to the hospital, discharge time is determined by your treatment team.  _  Patients discharged the day of surgery will not be allowed to drive home.  You will need someone to drive you home and stay with you the night of your procedure.    Please read over the following fact sheets that you were given:   [facility] Preparing for Surgery/INCENTIVE SPIROMETER INSTRUCTIONS-THE INCENTIVE SPIROMETER DEVICE WILL BE GIVEN THE DAY OF SURGERY  _x___ TAKE THE FOLLOWING MEDICATION THE MORNING OF SURGERY WITH A SMALL SIP OF WATER. These include:  1. LIPITOR (ATORVASTATIN)  2. METOPROLOL (TOPROL)  3. ZOLOFT (SERTRALINE)  4.  5.  6.  ____Fleets enema or Magnesium Citrate as directed.   _x___ Use CHG Soap as directed on instruction sheet   _X___ Use inhalers on the day of surgery and bring to hospital day of surgery-USE YOUR ALBUTEROL INHALER THE DAY OF SURGERY AND BRING INHALER TO [place]  ____ Stop Metformin and Janumet 2 days prior to surgery.    ____ Take 1/2 of usual insulin  dose the night before surgery and none on the morning surgery.   _x___ Follow recommendations from Cardiologist, Pulmonologist or PCP regarding stopping Aspirin, Coumadin, Plavix ,Eliquis, Effient, or Pradaxa, and Pletal-LAST DOSE OF ASPIRIN WAS ON 09-06-20  X____Stop Anti-inflammatories such as Advil, Aleve, Ibuprofen, Motrin, Naproxen, Naprosyn, Goodies powders or aspirin products NOW-OK to take Tylenol    _x___ Stop supplements until after surgery-STOP YOUR PRESERVISION AND BIOTIN NOW-YOU MAY RESUME AFTER SURGERY   ____ Bring C-Pap to the hospital.

## 2020-09-07 NOTE — Pre-Procedure Instructions (Signed)
Progress Notes - documented in this encounter Tina Dibble, MD - 08/10/2020 11:30 AM EDT Formatting of this note is different from the original. Established Patient Visit   Chief Complaint: Chief Complaint  Patient presents with   Coronary Artery Disease   Palpitations   Carotid Artery Stenosis   Hypertension  Date of Service: 08/10/2020 Date of Birth: [birth year] PCP: Harrold Donath, MD  History of Present Illness: Tina Frey is a 79 y.o.female patient  Coronary Artery Atherosclerosis The patient has had development of coronary artery atherosclerosis years ago due to risk factors including age, postmenopausal female, HTN and Hyperlipidemia currently without apparent significant symptoms and/or side effects of medication management. The patient has had previous assessment and treatment including cardiac CTThe patient's recent exercise tolerance is reasonable without any significant anginal symptoms and or anginal equivalent. This suggests no current need in further ischemia testing Essential hypertension The patient currently has a diagnosis of essential hypertension with no evidence of secondary causes of high blood pressure at this time. The patient has been on appropriate medication management without current evidence of significant side effects. This regimen is for risk reduction of cardiovascular disease and possible future complication. The blood pressure appears stable today. We have discussed treatment goals and current guidelines for hypertension therapy. The patient understands risks and benefits of medication management and risk factor modification. They agree to continuation of this current medical regimen. Mitral Valve Insufficiency The patient has had a diagnosis of non rheumatic mitral valve insufficiency years ago. This diagnosis has been made by echocardiogram years ago. The patient has had symptoms of mitral valve insufficiency including none associated  with climbing stairs, walking fast and exercise and relived by nothing stable. Current associated causes include Hypertension and cardiomegally  Peripheral Vascular Disease The patient has chronic peripheral vascular disease including carotid atherosclerosis and aortic atherosclerosis without significant symptoms and/or complications on daily ASA, Beta blocker, Lipid lowering medication and Blood pressure treatment. Current risk factors include Hypertension , Coronary disease and Peripheral vascular disease . We have discussed risk factor modification including treatment of high blood pressure and high cholesterol with medication management for further long-term risk reduction. The patient understands the long term risks of vascular disease morbidity and mortality and agrees with treatment plans listed below.  Mixed Hyperlipidemia The patient does have a diagnosis of mixed hyperlipidemia for which the patient is currently receiving High intensity therapy with atorvastatin (Lipitor) for risk factors that include high LDL, vascular disease, coronary artery disease and >7.5% ten year cardiovascular risk score. The patient has tolerated this treatment without significant side effects. They also understand the importance of diet and exercise and healthy lifestyle measures to reduce cardiovascular risk and help with prevention as well. The patient has been compliant with their medications. We have also discussed that ultralow LDL C treatment is safe with no apparent downside. A significant component of residual risk is an adequate LDL-C lowering in the large majority of patients.now ldl is 66 Preoperative Assessment Profile Risk factors for cardiac complication with stimulator surgery: Positive for: Coronary artery disease and peripheral vascular disease Negative for: Diabetes, Cardiomyopathy or chf and Chronic Kidney disease Active cardiovascular conditions: Positive QZE:SPQZ Negative for: Angina or anginal  equivalent, Recent infartion, Congestive heart failure symptoms, Dysrhythmia and Symptomatic valve disease Functional capacity >4 Mets Risk of surgery and/or procedure low Possible need and adjustments in therapy prior to surgery and/or procedure no Overall risk of cardiac complication with surgery and/or procedure is low, <1%  Past Medical and Surgical History  Past Medical History Past Medical History:  Diagnosis Date   Allergy  SLIGHT ALLERGIES.   Arthritis   Asthma   Cataract cortical, senile 2006   Colitis   COPD (chronic obstructive pulmonary disease) (CMS-HCC)   Depression   GERD (gastroesophageal reflux disease)   Heart murmur, unspecified ????????????   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   Tubular adenoma of colon, unspecified 07/19/2016   Past Surgical History She has a past surgical history that includes Breast excisional biopsy (Right, 1982); Abdominal hysterectomy (1994); Cholecystectomy (1989); cmc joint repair (Left, 2003); Endoscopic Carpal Tunnel Release (Left, 2003); Appendectomy (2005); cataract surgery (Bilateral, 2006); Intro Cath To Main Pulmonary Artery/Right Heart (2007); hammertoe repair (2007); arthroscopy of knee (2008); total knee replacement (2008); Endoscopic Carpal Tunnel Release (Right, 2010); Total shoulder replacement (2013); Appendectomy (2005); Cataract extraction (02/2005 (L); 03/2005 (R)); Hernia repair (1990 (R); 08/2004 (L)); Cholecystectomy (1989); Hysterectomy (1994); Joint replacement (7001;7494); Knee arthroscopy (2008); Colonoscopy (07/19/2016); and Carpal tunnel release.   Medications and Allergies  Current Medications  Current Outpatient Medications on File Prior to Visit   Medication Sig Dispense Refill   albuterol 90 mcg/actuation inhaler Inhale 2 inhalations into the lungs every 6 (six) hours as needed for Wheezing 1 Inhaler 1   aspirin 81 MG EC tablet Take 81 mg by mouth once daily.   atorvastatin (LIPITOR) 20 MG tablet TAKE 1 TABLET(20 MG) BY MOUTH EVERY DAY 90 tablet 1   biotin 1 mg tablet Take 5,000 mcg by mouth once daily.   DULoxetine (CYMBALTA) 20 MG DR capsule Take 1 capsule (20 mg total) by mouth once daily 90 capsule 2   fluticasone propionate (FLONASE) 50 mcg/actuation nasal spray Place 2 sprays into both nostrils once daily 16 g 11   ibuprofen (MOTRIN) 200 MG tablet Take by mouth Take 400 mg by mouth every 8 (eight) hours as needed.   lisinopriL (ZESTRIL) 10 MG tablet TAKE 1 TABLET(10 MG) BY MOUTH EVERY DAY 90 tablet 1   metoprolol tartrate (LOPRESSOR) 50 MG tablet TAKE 1/2 TABLET(25 MG) BY MOUTH TWICE DAILY 90 tablet 1   sertraline (ZOLOFT) 50 MG tablet TAKE 1 TABLET(50 MG) BY MOUTH EVERY DAY 90 tablet 1   traZODone (DESYREL) 50 MG tablet TAKE 1 TABLET(50 MG) BY MOUTH EVERY NIGHT 30 tablet 5   VIT C/E/ZN/COPPR/LUTEIN/ZEAXAN (PRESERVISION AREDS 2 ORAL) Take 2 tablets by mouth once daily.   No current facility-administered medications on file prior to visit.   Allergies: Cephalexin, Ciprofloxacin, Morphine, and Sulfa (sulfonamide antibiotics)  Social and Family History  Social History reports that she quit smoking about 15 years ago. Her smoking use included cigarettes. She has a 20.00 pack-year smoking history. She has never used smokeless tobacco. She reports previous alcohol use. She reports that she does not use drugs.  Family History Family History  Problem Relation Age of Onset   Ovarian cancer Mother   Cancer Mother   Coronary Artery Disease (Blocked arteries around heart) Father  ENLARGED HEART   Review of Systems   Review of Systems  Positive for back pain Negative for weight gain weight loss, weakness, vision  change, hearing loss, cough, congestion, PND, orthopnea, heartburn, nausea, diaphoresis, vomiting, diarrhea, melena, stomach pain,  extremity pain, leg weakness, leg cramping, leg blood clots, headache, blackouts, nosebleed, trouble swallowing, mouth pain, urinary frequency, urination at night, muscle weakness, skin lesions, skin rashes, tingling ,ulcers, numbness, anxiety, and/or depression Physical Examination   Vitals:BP 130/80   Pulse 62   Ht 154.9 cm (5\' 1" )   Wt 56.2 kg (124 lb)   LMP (LMP Unknown)   SpO2 93%   BMI 23.43 kg/m  Ht:154.9 cm (5\' 1" ) Wt:56.2 kg (124 lb) IRJ:JOAC surface area is 1.56 meters squared. Body mass index is 23.43 kg/m. Appearance: well appearing in no acute distress HEENT: Pupils equally reactive to light and accomodation, no xanthalasma  Neck: Supple, no apparent thyromegaly, masses, or lymphadenopathy  Lungs: normal respiratory effort; no crackles, no rhonchi, no wheezes Heart: Regular rate and rhythm. Normal S1 S2 No gallops, murmur, no rub, PMI is normal size and placement. carotid upstroke normal without bruit. Jugular venous pressure is normal Abdomen: soft, nontender, not distended with normal bowel sounds. No apparent hepatosplenomegally. Abdominal aorta is normal size without bruit Extremities: no edema, no ulcers, no clubbing, no cyanosis Peripheral Pulses: 2+ in upper extremities, 2+ femoral pulses bilaterally, 2+lower extremity  Musculoskeletal; Normal muscle tone without kyphosis Neurological: Oriented and Alert, Cranial nerves intact  Assessment   79 y.o. female with  Encounter Diagnoses  Name Primary?   Bilateral carotid artery stenosis   Coronary artery disease involving native coronary artery of native heart without angina pectoris Yes   Essential hypertension   Moderate mitral insufficiency   Chronic obstructive pulmonary disease, unspecified COPD type (CMS-HCC)   Plan  -Proceed to surgery and/or invasive procedure without restriction  to pre or post operative and/or procedural care. The patient is at lowest risk possible for cardiovascular complications with surgical intervention and/or invasive procedure. Currently has no evidence active and/or significant angina and/or congestive heart failure. The patient may discontinue aspirin 4 days prior to procedure and restart at a safe period thereafter - continue anti-platelet medication management for carotid atherosclerosis and aortic atherosclerosis at this time. Patient understands risk and benefits of this medication management which includes reducing vascular morbidity and mortality in the future. They understand the risks of this treatment including bleeding and possible bruising as well. Other cardiovascular risk management will also be continued including blood pressure control, lipid treatment, and lifestyle changes. -Continue risk factor modification with medication management and other therapy listed above for coronary artery atherosclerosis which appears clinically stable at this time. We have discussed the variability in clinical presentation of cardiovascular disease and for the patient to communicate any new symptoms and or changes if they occur. -Continue current medical regimen for hypertension control which is stable at this time and without apparent significant side effects or symptoms of medications. Further treatment goals of low sodium diet for additive effects of these medications have been discussed today as well. -No further intervention of mitral valve insufficiency at this time. The patient will continue to watch closely for further significant symptoms of progression of mitral valve disease. -Continue to use moderate to high intensive cholesterol therapy for further future risk reduction in cardiovascular disease and complication. The patient currently understands the goals, risks, and benefits of lipid treatment. We have discussed the potential side effects profile of  these medications and or symptoms. They will watch for any new symptoms. -We have had a long discussion about the benefits of physical and occupational rehabilitation. The patient is advised and encouraged to enroll for improvements in quality of life and reduced hospitalization.  No orders of the defined types were placed in this encounter.  Return in about 6 months (around 02/10/2021).  Tina Dibble, MD    Electronically signed by Tina Dibble, MD at 08/10/2020 11:56 AM EDT  Plan of Treatment - documented as of this encounter Upcoming Encounters Upcoming Encounters  Date Type Specialty Care Team Description  09/08/2020 Office Visit Orthopaedics Poggi, Smith Mince, MD  [place]  [facility] [place], [place] [address]  [phone]  [phone] ([address]    Feliberto Gottron, [place]  [place], [place] [address]  [phone]  [phone] (Fax)    09/18/2020 Post Op Orthopaedics Poggi, Smith Mince, MD  [place]  [facility] [place], [place] [address]  [phone]  [phone] ([address])    Lattie Corns, [place]  [place], [place] [address]  [phone]  [phone] (Fax)    10/23/2020 Post Op Orthopaedics Poggi, Smith Mince, MD  [place]  [facility] [place], [place] [address]  [phone]  [phone] ([phone]    11/19/2020 Office Visit Neurology Ray Church, [place]  [facility]  [place], [place] [address]  [phone]  [phone])    [place], [place], [place] [address]  [phone]  [phone] ([phone]    12/10/2020 Ancillary Orders Lab Harrold Donath, MD  [place]  [facility] [place], [place] [address]  [phone]  [phone] (Fax)    12/17/2020  Office Visit Internal Medicine Harrold Donath, MD  [place]  [facility] [place], [place] [address]  [phone]  [phone] (Fax)    06/29/2021 Ancillary Procedure Rheumatology Solum, Felipa Evener, MD  [place]  [facility] [place], [place] [address]  [phone]  [phone] ([phone]    07/06/2021 Office Visit Endocrinology Solum, Felipa Evener, MD  [place]  [facility] [place], [place] [address]  [phone]  [phone] (Fax)    Goals - documented as of this encounter Goal Patient Goal Type Associated Problems Recent Progress Patient-Stated? Social research officer, government a healthy eating plan at home  General  On track (06/17/2020 10:27 AM EDT) Yes Sarita Haver, G-RN  Maintain health/healthy lifestyle  Lifestyle  On track (06/17/2020 10:27 AM EDT) Yes Stegall, Amy, CMA  Note:   Formatting of this note might be different from the original. "watch what I eat"   Lose Weight  Weight  On track (06/17/2020 10:27 AM EDT) Yes Stegall, Amy, CMA  Visit Diagnoses - documented in this encounter Diagnosis  Coronary artery disease involving native coronary artery of native heart without angina pectoris - Primary   Bilateral carotid artery stenosis  Occlusion and stenosis of carotid artery without mention of cerebral infarction   Essential hypertension   Moderate mitral insufficiency   Chronic obstructive pulmonary disease, unspecified COPD type (CMS-HCC)   Images Patient Demographics  Patient Address Communication Language Race / Ethnicity Marital Status  [address] [place], [place] [address] [id number] [facility]) [phone] (Home) [email or web address] English (Preferred) White / Not Hispanic or Latino Widowed  Patient Contacts  Contact Name Contact Address Communication Relationship to Patient  Gertie Exon [address] [place],  [address] [phone]  [facility]) [phone] (Mobile) Brother or Sister, Emergency Contact  Document Information  Primary Care Provider Other Service Providers Document Coverage  Dates  Harrold Donath, MD (Apr. 11, 2019April 11, 2019 - Present) [phone] (Work) [phone] (Fax) [facility] [place], [place] [address] Internal Medicine [facility] [address] [place], [place] [address]  Aug. 23, 2021August 23, 2021   [employer] [address] [place], [place] [address]   Encounter Providers Encounter Date  Tina Dibble, MD (Attending) [phone] (Work) [phone] (Fax) [facility] [place], [place] [address] Cardiovascular Disease Aug. 23, 2021August 23, 2021    Show All Sections

## 2020-09-07 NOTE — Progress Notes (Signed)
  [facility] Perioperative Services: Pre-Admission/Anesthesia Testing   Date: 09/07/20  Name: Tina Frey MRN:   [id number]  Re: Consideration of perioperative therapeutic ABX change in patient with PCN allergy  Request sent to: Poggi, Marshall Cork, MD Notification mode: Routed and/or faxed via [facility]   Procedure: RIGHT KNEE ARTHROSCOPY WITH DEBRIDEMENT, REMOVAL OF LOOSE BODY, POSSIBLE SUBCHONDROPLASTY AND POSSIBLE Ruch. (Right Knee) Date of procedure: 09/09/2020  Notes: 1. Patient has a NO documented allergy to PCN.  Marland Kitchen Advising that CEPHALEXIN has caused her to experience mild nausea in the past.  2. Screened as appropriate for cephalosporin use during medication reconciliation . No immediate angioedema, dysphagia, SOB, or anaphylaxis symptoms. . No severe rash involving mucous membranes or skin necrosis. . No hospital admissions related to side effects of PCN/cephalosporin use.  . No documented reaction to PCN or cephalosporin in the last 10 years.  Currently ordered preoperative prophylactic ABX: clindamycin.   Request: As an evidence based approach to reducing the rate of incidence for post-operative SSI and the development of MDROs, could an agent with narrower coverage for preoperative prophylaxis in this patient's upcoming surgical course be considered?  1. Specifically requesting change to cephalosporin (CEFAZOLIN).  2. Please have your staff communicate decision with me and I would be happy to change the orders in Epic as per your direction.   Things to consider: . Many patients report that they were "allergic" to PCN earlier in life, however this does not translate into a true lifelong allergy. Patients can lose sensitivity to specific IgE antibodies over time if PCN is avoided (Kleris & Lugar, 2019).  Marland Kitchen Up to 10% of the adult population and 15% of hospitalized patients report an allergy to PCN, however clinical studies suggest that 90% of  those reporting an allergy can tolerate PCN antibiotics (Kleris & Lugar, 2019).  . Cross-sensitivity between PCN and cephalosporins has been documented as being as high as 10%, however this estimation included data believed to have been collected in a setting where there was contamination. Newer data suggests that the prevalence of cross-sensitivity between PCN and cephalosporins is actually estimated to be closer to 1% (Hermanides et al., 2018).   . Patients labeled as PCN allergic, whether they are truly allergic or not, have been found to have inferior outcomes in terms of rates of serious infection, and these patients tend to have longer hospital stays ([place], 2019).  . Treatment related secondary infections, such as Clostridioides difficile, have been linked to the improper use of broad spectrum antibiotics in patients improperly labeled as PCN allergic (Kleris & Lugar, 2019).  Marland Kitchen Anaphylaxis from cephalosporins is rare and the evidence suggests that there is no increased risk of an anaphylactic type reaction when cephalosporins are used in a PCN allergic patient (Pichichero, 2006).   Citations Hermanides J, Lemkes BA, Prins JM, Hollmann MW, Terreehorst I. Presumed ?-Lactam Allergy and Cross-reactivity in the Operating Theater: A Practical Approach. Anesthesiology. 2018 Aug;129(2):335-342. doi: 10.1097/ALN.0000000000002252. PMID: 35597416.  Kleris, [place]., & Lugar, P. L. (2019). Things We Do For No Reason: Failing to Question a Penicillin Allergy History. Journal of hospital medicine, 14(10), [phone]. Advance online publication. https://www.wallace-middleton.info/  Pichichero, M. E. (2006). Cephalosporins can be prescribed safely for penicillin-allergic patients. Journal of family medicine, 55(2), 106-112. Accessed: https://cdn.mdedge.com/files/s64fs-public/Document/September-2017/5502JFP_AppliedEvidence1.pdf  Honor Loh, MSN, APRN, FNP-C, CEN [facility]  Peri-operative  Services Nurse Practitioner Phone: [phone] 09/07/20 1:31 PM

## 2020-09-07 NOTE — Pre-Procedure Instructions (Signed)
ECG 12-lead  Component 1 yr ago  Vent Rate (bpm)  65     PR Interval (msec)  202     QRS Interval (msec)  76     QT Interval (msec)  382     QTc (msec)  397     Narrative  Normal sinus rhythm  Nonspecific ST abnormality  Abnormal ECG  When compared with ECG of 30-May-2018 10:23,  No significant change was found  I reviewed and concur with this report. Electronically signed FS:FSELTRVU MD, Darnell Level [phone]) on 07/05/2019 8:29:51 AM Other Result Text  This result has an attachment that is not available. Specimen Collected: --  Date: 07/01/19  Received From: [place]   Encounter Summary

## 2020-09-08 ENCOUNTER — Other Ambulatory Visit: Payer: Medicare Other

## 2020-09-08 ENCOUNTER — Encounter
Admission: RE | Admit: 2020-09-08 | Discharge: 2020-09-08 | Disposition: A | Discharge status: Home / Self Care | Payer: Medicare Other | Source: Ambulatory Visit | Attending: Surgery | Admitting: Surgery

## 2020-09-08 DIAGNOSIS — Z01812 Encounter for preprocedural laboratory examination: Secondary | ICD-10-CM | POA: Insufficient documentation

## 2020-09-08 DIAGNOSIS — Z01818 Encounter for other preprocedural examination: Secondary | ICD-10-CM | POA: Diagnosis not present

## 2020-09-08 DIAGNOSIS — Z20822 Contact with and (suspected) exposure to covid-19: Secondary | ICD-10-CM | POA: Insufficient documentation

## 2020-09-08 DIAGNOSIS — Z01810 Encounter for preprocedural cardiovascular examination: Secondary | ICD-10-CM | POA: Insufficient documentation

## 2020-09-08 HISTORY — DX: Cardiac murmur, unspecified: R01.1

## 2020-09-08 HISTORY — DX: Occlusion and stenosis of unspecified carotid artery: I65.29

## 2020-09-08 NOTE — Progress Notes (Signed)
[facility] Perioperative Services  Pre-Admission/Anesthesia Testing Clinical Review  Date: 09/08/20  Patient Demographics:  Name: Tina Frey DOB:   [birth year] MRN:   [id number]  Planned Surgical Procedure(s):    Case: [id number] Date/Time: 09/09/20 0715   Procedure: RIGHT KNEE ARTHROSCOPY WITH DEBRIDEMENT, REMOVAL OF LOOSE BODY, POSSIBLE SUBCHONDROPLASTY AND POSSIBLE LATERAL MENISCECTOMY. (Right Knee)   Anesthesia type: Choice   Pre-op diagnosis:      Loose body in joint M24.00     Primary osteoarthritis of right knee M17.11     Other tear of lateral meniscus of right knee as current injury, initial encounter S83.281A   Location: [place] [facility] [place] [facility]   Surgeons: Tina Mull, MD     NOTE: Available PAT nursing documentation and vital signs have been reviewed. Clinical nursing staff has updated patient's PMH/PSHx, current medication list, and drug allergies/intolerances to ensure comprehensive history available to assist in medical decision making as it pertains to the aforementioned surgical procedure and anticipated anesthetic course.   Clinical Discussion:  Tina Frey is a 79 y.o. female who is submitted for pre-surgical anesthesia review and clearance prior to her undergoing the above procedure. Patient is a Former Smoker (20 pack years; quit 12/2003). Pertinent PMH includes: CAD, MVP, heart murmur, carotid artery stenosis, HLD, HTN, COPD, chronic lower back pain, chronic opioid use, depression.  Patient is followed by cardiology Tina Massed, MD). She was last seen in the cardiology clinic on 08/10/2020; notes reviewed.  At the time of her clinic visit, patient doing well from cardiovascular standpoint.  She denied any angina or anginal equivalent symptoms.  Hypertension well controlled on beta-blocker and ACEi therapy.  Patient takes daily aspirin as cardioprotective measure.  She is on daily statin for her HLD.  Functional capacity  documented as >4 METS.  Last myocardial perfusion imaging done in 05/2016 revealed an LVEF of 54%.  There were no regional wall motion abnormalities; low risk study.  TTE done in 05/2016 revealed an LVEF of 50% (see full cardiac testing results below).  Patient scheduled to undergo elective orthopedic surgery on 09/09/2020 with Dr. Milagros Evener.  Given patient's past medical history significant for cardiac issues, presurgical cardiac clearance was sought by attending surgical team.  Per cardiology, risk of surgery and/or procedure felt to be low.  There are no possible needs or adjustments in therapy prior to surgery and/or procedure.  Overall risk of cardiac complication with surgery and/or procedure <1%.  Patient is optimized for surgery and "may proceed with a low risk ratification". This patient is on daily antiplatelet therapy. She has been instructed on recommendations for holding her daily low-dose ASA for 4 days prior to her procedure. The patient has been instructed that her last dose of her anticoagulant will be on 09/05/2020  She denies previous perioperative complications with anesthesia. She underwent a general anesthetic course here (ASA II) in 09/2017 with no documented complications.   Vitals with BMI 08/26/2020 08/26/2020 08/26/2020  Height - - 5\' 2"   Weight - - 126 lbs  BMI - - 25.05  Systolic 397 673 419  Diastolic 75 75 64  Pulse - - 71    Providers/Specialists:   NOTE: Primary physician provider listed below. Patient may have been seen by APP or partner within same practice.   PROVIDER ROLE LAST OV  Poggi, Tina Cork, MD Orthopedic (Surgeon) 09/01/2020  Tina Ruths, MD Primary Care Provider 06/17/2020  Tina Royals, MD Cardiology 05/10/2020  Allergies:  Ciprofloxacin, Morphine and related, Cephalexin, and Sulfa antibiotics  Current Home Medications:   . acetaminophen (TYLENOL) 500 MG tablet  . albuterol (PROVENTIL HFA;VENTOLIN HFA) 108 (90 Base) MCG/ACT inhaler  .  aspirin EC 81 MG tablet  . atorvastatin (LIPITOR) 20 MG tablet  . Biotin 5000 MCG CAPS  . Cholecalciferol (VITAMIN D3) 2000 units capsule  . fluticasone (FLONASE) 50 MCG/ACT nasal spray  . ibuprofen (ADVIL) 200 MG tablet  . lisinopril (PRINIVIL,ZESTRIL) 10 MG tablet  . metoprolol tartrate (LOPRESSOR) 50 MG tablet  . montelukast (SINGULAIR) 10 MG tablet  . Multiple Vitamins-Minerals (PRESERVISION AREDS PO)  . sertraline (ZOLOFT) 50 MG tablet  . traZODone (DESYREL) 50 MG tablet   No current facility-administered medications for this encounter.   History:   Past Medical History:  Diagnosis Date  . Allergy   . Arthritis   . Arthritis   . Cancer ([place])    basal cell  . Carotid artery stenosis   . Cataract   . COPD (chronic obstructive pulmonary disease) ([place])   . Heart murmur   . Hyperlipidemia   . Hypertension   . Mitral valve prolapse   . Scoliosis   . Wears dentures    Past Surgical History:  Procedure Laterality Date  . ABDOMINAL HYSTERECTOMY    . APPENDECTOMY    . ARTHROSCOPY KNEE W/ DRILLING    . biopsy lip    . BREAST EXCISIONAL BIOPSY Right 1982   NEG  . BREAST SURGERY Right    biopsy /benigning  . BUNIONECTOMY     right  . CARDIAC CATHETERIZATION    . CARDIOVASCULAR STRESS TEST    . CARPAL TUNNEL RELEASE     left  . CARPAL TUNNEL RELEASE    . CATARACT EXTRACTION     left  . CHOLECYSTECTOMY    . cmc joint repair     left  . COLONOSCOPY WITH PROPOFOL N/A 07/19/2016   Procedure: COLONOSCOPY WITH PROPOFOL;  Surgeon: Lollie Sails, MD;  Location: [facility];  Service: Endoscopy;  Laterality: N/A;  . CYSTOSCOPY N/A 10/11/2017   Procedure: CYSTOSCOPY;  Surgeon: Bjorn Loser, MD;  Location: [facility] ORS;  Service: Urology;  Laterality: N/A;  . EYE SURGERY     bilateral  . HERNIA REPAIR     bilateral inguinal  . JOINT REPLACEMENT Left    knee replacement  . MOHS SURGERY    . PLANTAR FASCIA RELEASE Left 11/23/2016   Procedure: ENDOSCOPIC PLANTAR  FASCIA RELEASE;  Surgeon: [name], DPM;  Location: [place];  Service: Podiatry;  Laterality: Left;  . PUBOVAGINAL SLING N/A 10/11/2017   Procedure: Gaynelle Arabian;  Surgeon: Bjorn Loser, MD;  Location: [facility] ORS;  Service: Urology;  Laterality: N/A;  . REPLACEMENT TOTAL KNEE     left  . right shoulder surgery    . TARSAL TUNNEL RELEASE Left 11/23/2016   Procedure: TARSAL TUNNEL RELEASE;  Surgeon: [name], DPM;  Location: [place];  Service: Podiatry;  Laterality: Left;  POPLITEAL   Family History  Problem Relation Age of Onset  . Cancer Mother   . Heart disease Father   . Heart disease Brother   . Breast cancer Neg Hx   . Bladder Cancer Neg Hx   . Kidney cancer Neg Hx    Social History   Tobacco Use  . Smoking status: Former Smoker    Packs/day: 1.00    Years: 20.00    Pack years: 20.00    Types: Cigarettes  Quit date: 2005    Years since quitting: 16.7  . Smokeless tobacco: Never Used  Vaping Use  . Vaping Use: Never used  Substance Use Topics  . Alcohol use: Yes    Alcohol/week: 0.0 standard drinks    Comment: OCC  . Drug use: No    Pertinent Clinical Results:  LABS: Labs reviewed: Acceptable for surgery.      ECG: Date: 09/08/2020 Time ECG obtained: 0921 AM Rate: 69 bpm Rhythm: normal sinus Axis (leads I and aVF): Normal Intervals: PR 190 ms. QRS 78 ms. QTc 415 ms. ST segment and T wave changes: No evidence of acute ST segment elevation or depression Comparison: Similar to previous tracing obtained on 10/05/2017   IMAGING / PROCEDURES: LEXISCAN done on 05/24/2016 1. LVEF 54% 2. Regional wall motion reveals normal myocardial thickening and wall motion 3. The overall quality of the study is good 4. No artifacts noted 5. Normal left ventricular cavity  ECHOCARDIOGRAM done on 05/24/2016 1. Normal LV systolic function 2. Normal RV systolic function 3. Moderate MR, mild TR, trivial PR, no AR 4. No evidence of  valvular stenosis 5. Moderate LA enlargement 6. Mild MVP  Impression and Plan:  BERLYN SAYLOR has been referred for pre-anesthesia review and clearance prior to her undergoing the planned anesthetic and procedural courses. Available labs, pertinent testing, and imaging results were personally reviewed by me. This patient has been appropriately cleared by cardiology.   Based on clinical review performed today (09/08/20), barring any significant acute changes in the patient's overall condition, it is anticipated that shewill be able to proceed with the planned surgical intervention. Any acute changes in clinical condition may necessitate her procedure being postponed and/or cancelled. Pre-surgical instructions were reviewed with the patient during her PAT appointment and questions were fielded by PAT clinical staff.  Honor Loh, MSN, APRN, FNP-C, CEN [facility]  Peri-operative Services Nurse Practitioner Phone: [phone] 09/08/20 11:29 AM  NOTE: This note has been prepared using Dragon dictation software. Despite my best ability to proofread, there is always the potential that unintentional transcriptional errors may still occur from this process.

## 2020-09-09 ENCOUNTER — Encounter: Payer: Self-pay | Admitting: Surgery

## 2020-09-09 ENCOUNTER — Ambulatory Visit
Admission: RE | Admit: 2020-09-09 | Discharge: 2020-09-09 | Disposition: A | Discharge status: Home / Self Care | Payer: Medicare Other | Attending: Surgery | Admitting: Surgery

## 2020-09-09 ENCOUNTER — Other Ambulatory Visit: Payer: Self-pay

## 2020-09-09 ENCOUNTER — Ambulatory Visit: Payer: Medicare Other

## 2020-09-09 ENCOUNTER — Encounter
Admission: RE | Disposition: A | Discharge status: Home / Self Care | Payer: Self-pay | Source: Home / Self Care | Attending: Surgery

## 2020-09-09 ENCOUNTER — Ambulatory Visit: Payer: Medicare Other | Admitting: Urgent Care

## 2020-09-09 ENCOUNTER — Ambulatory Visit: Payer: Medicare Other | Admitting: Anesthesiology

## 2020-09-09 DIAGNOSIS — I10 Essential (primary) hypertension: Secondary | ICD-10-CM | POA: Insufficient documentation

## 2020-09-09 DIAGNOSIS — M6751 Plica syndrome, right knee: Secondary | ICD-10-CM | POA: Diagnosis not present

## 2020-09-09 DIAGNOSIS — Z85828 Personal history of other malignant neoplasm of skin: Secondary | ICD-10-CM | POA: Insufficient documentation

## 2020-09-09 DIAGNOSIS — Z79899 Other long term (current) drug therapy: Secondary | ICD-10-CM | POA: Insufficient documentation

## 2020-09-09 DIAGNOSIS — X501XXA Overexertion from prolonged static or awkward postures, initial encounter: Secondary | ICD-10-CM | POA: Diagnosis not present

## 2020-09-09 DIAGNOSIS — Z96652 Presence of left artificial knee joint: Secondary | ICD-10-CM | POA: Diagnosis not present

## 2020-09-09 DIAGNOSIS — M1711 Unilateral primary osteoarthritis, right knee: Secondary | ICD-10-CM | POA: Insufficient documentation

## 2020-09-09 DIAGNOSIS — M84361A Stress fracture, right tibia, initial encounter for fracture: Secondary | ICD-10-CM | POA: Insufficient documentation

## 2020-09-09 DIAGNOSIS — F329 Major depressive disorder, single episode, unspecified: Secondary | ICD-10-CM | POA: Diagnosis not present

## 2020-09-09 DIAGNOSIS — M2341 Loose body in knee, right knee: Secondary | ICD-10-CM | POA: Diagnosis not present

## 2020-09-09 DIAGNOSIS — Z7982 Long term (current) use of aspirin: Secondary | ICD-10-CM | POA: Insufficient documentation

## 2020-09-09 DIAGNOSIS — Z96612 Presence of left artificial shoulder joint: Secondary | ICD-10-CM | POA: Diagnosis not present

## 2020-09-09 DIAGNOSIS — Z419 Encounter for procedure for purposes other than remedying health state, unspecified: Secondary | ICD-10-CM

## 2020-09-09 DIAGNOSIS — J449 Chronic obstructive pulmonary disease, unspecified: Secondary | ICD-10-CM | POA: Insufficient documentation

## 2020-09-09 DIAGNOSIS — S83271A Complex tear of lateral meniscus, current injury, right knee, initial encounter: Secondary | ICD-10-CM | POA: Insufficient documentation

## 2020-09-09 DIAGNOSIS — E785 Hyperlipidemia, unspecified: Secondary | ICD-10-CM | POA: Diagnosis not present

## 2020-09-09 DIAGNOSIS — Z87891 Personal history of nicotine dependence: Secondary | ICD-10-CM | POA: Diagnosis not present

## 2020-09-09 HISTORY — PX: KNEE ARTHROSCOPY WITH SUBCHONDROPLASTY: SHX6732

## 2020-09-09 LAB — SARS CORONAVIRUS 2 (TAT 6-24 HRS): SARS Coronavirus 2: NEGATIVE

## 2020-09-09 SURGERY — ARTHROSCOPY, KNEE, WITH SUBCHONDROPLASTY
Anesthesia: General | Site: Knee | Laterality: Right

## 2020-09-09 MED ORDER — CHLORHEXIDINE GLUCONATE 0.12 % MT SOLN
OROMUCOSAL | Status: AC
  Administered 2020-09-09: 15 mL via OROMUCOSAL
  Filled 2020-09-09: qty 15

## 2020-09-09 MED ORDER — DEXAMETHASONE SODIUM PHOSPHATE 10 MG/ML IJ SOLN
INTRAMUSCULAR | Status: DC | PRN
  Administered 2020-09-09: 4 mg via INTRAVENOUS

## 2020-09-09 MED ORDER — PHENYLEPHRINE HCL (PRESSORS) 10 MG/ML IV SOLN
INTRAVENOUS | Status: DC | PRN
  Administered 2020-09-09: 100 ug via INTRAVENOUS

## 2020-09-09 MED ORDER — FAMOTIDINE 20 MG PO TABS: 20.0000 mg | ORAL_TABLET | Freq: Once | ORAL | Status: AC

## 2020-09-09 MED ORDER — FENTANYL CITRATE (PF) 100 MCG/2ML IJ SOLN
INTRAMUSCULAR | Status: AC
  Filled 2020-09-09: qty 2

## 2020-09-09 MED ORDER — ONDANSETRON HCL 4 MG/2ML IJ SOLN
INTRAMUSCULAR | Status: DC | PRN
  Administered 2020-09-09: 4 mg via INTRAVENOUS

## 2020-09-09 MED ORDER — ONDANSETRON HCL 4 MG PO TABS: 4.0000 mg | ORAL_TABLET | Freq: Four times a day (QID) | ORAL | Status: DC | PRN

## 2020-09-09 MED ORDER — BUPIVACAINE-EPINEPHRINE (PF) 0.5% -1:200000 IJ SOLN
INTRAMUSCULAR | Status: DC | PRN
  Administered 2020-09-09: 50 mL via PERINEURAL

## 2020-09-09 MED ORDER — LACTATED RINGERS IV SOLN
INTRAVENOUS | Status: DC
  Administered 2020-09-09: 50 mL/h via INTRAVENOUS

## 2020-09-09 MED ORDER — CHLORHEXIDINE GLUCONATE 0.12 % MT SOLN: 15.0000 mL | Freq: Once | OROMUCOSAL | Status: AC

## 2020-09-09 MED ORDER — ACETAMINOPHEN 10 MG/ML IV SOLN
INTRAVENOUS | Status: DC | PRN
  Administered 2020-09-09: 750 mg via INTRAVENOUS

## 2020-09-09 MED ORDER — CLINDAMYCIN PHOSPHATE 900 MG/50ML IV SOLN
900.0000 mg | INTRAVENOUS | Status: AC
  Administered 2020-09-09: 900 mg via INTRAVENOUS

## 2020-09-09 MED ORDER — BUPIVACAINE-EPINEPHRINE (PF) 0.5% -1:200000 IJ SOLN
INTRAMUSCULAR | Status: AC
  Filled 2020-09-09: qty 30

## 2020-09-09 MED ORDER — TRAMADOL HCL 50 MG PO TABS: 50.0000 mg | ORAL_TABLET | Freq: Four times a day (QID) | ORAL | Status: DC | PRN

## 2020-09-09 MED ORDER — ONDANSETRON HCL 4 MG/2ML IJ SOLN: 4.0000 mg | Freq: Four times a day (QID) | INTRAMUSCULAR | Status: DC | PRN

## 2020-09-09 MED ORDER — FENTANYL CITRATE (PF) 100 MCG/2ML IJ SOLN
INTRAMUSCULAR | Status: DC | PRN
Start: 2020-09-09 — End: 2020-09-09
  Administered 2020-09-09: 25 ug via INTRAVENOUS

## 2020-09-09 MED ORDER — TRAMADOL HCL 50 MG PO TABS: 50.0000 mg | ORAL_TABLET | Freq: Four times a day (QID) | ORAL | 0 refills | Status: DC | PRN

## 2020-09-09 MED ORDER — CLINDAMYCIN PHOSPHATE 900 MG/50ML IV SOLN
INTRAVENOUS | Status: AC
  Filled 2020-09-09: qty 50

## 2020-09-09 MED ORDER — LIDOCAINE HCL (PF) 1 % IJ SOLN
INTRAMUSCULAR | Status: DC | PRN
  Administered 2020-09-09: 30 mL

## 2020-09-09 MED ORDER — ONDANSETRON 4 MG PO TBDP: 4.0000 mg | ORAL_TABLET | Freq: Three times a day (TID) | ORAL | 1 refills | Status: DC | PRN

## 2020-09-09 MED ORDER — PROPOFOL 10 MG/ML IV BOLUS
INTRAVENOUS | Status: AC
  Filled 2020-09-09: qty 60

## 2020-09-09 MED ORDER — ORAL CARE MOUTH RINSE: 15.0000 mL | Freq: Once | OROMUCOSAL | Status: AC

## 2020-09-09 MED ORDER — METOCLOPRAMIDE HCL 10 MG PO TABS: 5.0000 mg | ORAL_TABLET | Freq: Three times a day (TID) | ORAL | Status: DC | PRN

## 2020-09-09 MED ORDER — PHENYLEPHRINE HCL-NACL 10-0.9 MG/250ML-% IV SOLN
INTRAVENOUS | Status: DC | PRN
  Administered 2020-09-09: 15 ug/min via INTRAVENOUS

## 2020-09-09 MED ORDER — PROPOFOL 10 MG/ML IV BOLUS
INTRAVENOUS | Status: DC | PRN
  Administered 2020-09-09: 80 mg via INTRAVENOUS
  Administered 2020-09-09: 40 mg via INTRAVENOUS

## 2020-09-09 MED ORDER — LIDOCAINE HCL (CARDIAC) PF 100 MG/5ML IV SOSY
PREFILLED_SYRINGE | INTRAVENOUS | Status: DC | PRN
  Administered 2020-09-09: 80 mg via INTRAVENOUS

## 2020-09-09 MED ORDER — FAMOTIDINE 20 MG PO TABS
ORAL_TABLET | ORAL | Status: AC
  Administered 2020-09-09: 20 mg via ORAL
  Filled 2020-09-09: qty 1

## 2020-09-09 MED ORDER — LIDOCAINE HCL (PF) 1 % IJ SOLN
INTRAMUSCULAR | Status: AC
  Filled 2020-09-09: qty 30

## 2020-09-09 MED ORDER — METOCLOPRAMIDE HCL 5 MG/ML IJ SOLN: 5.0000 mg | Freq: Three times a day (TID) | INTRAMUSCULAR | Status: DC | PRN

## 2020-09-09 MED ORDER — ACETAMINOPHEN 10 MG/ML IV SOLN
INTRAVENOUS | Status: AC
  Filled 2020-09-09: qty 100

## 2020-09-09 MED ORDER — FENTANYL CITRATE (PF) 100 MCG/2ML IJ SOLN: 25.0000 ug | INTRAMUSCULAR | Status: DC | PRN

## 2020-09-09 SURGICAL SUPPLY — 36 items
"PENCIL ELECTRO HAND CTR " (MISCELLANEOUS) IMPLANT
APL PRP STRL LF DISP 70% ISPRP (MISCELLANEOUS) ×1
BLADE FULL RADIUS 3.5 (BLADE) ×3 IMPLANT
BLADE SHAVER 4.5X7 STR FR (MISCELLANEOUS) ×3 IMPLANT
BNDG ELASTIC 6X5.8 VLCR STR LF (GAUZE/BANDAGES/DRESSINGS) ×3 IMPLANT
CHLORAPREP W/TINT 26 (MISCELLANEOUS) ×3 IMPLANT
COVER WAND RF STERILE (DRAPES) ×3 IMPLANT
CUFF TOURN 24 STER (MISCELLANEOUS) IMPLANT
CUFF TOURN 30 STER DUAL PORT (MISCELLANEOUS) IMPLANT
DRAPE C-ARM XRAY 36X54 (DRAPES) ×3 IMPLANT
DRAPE IMP U-DRAPE 54X76 (DRAPES) ×2 IMPLANT
ELECT REM PT RETURN 9FT ADLT (ELECTROSURGICAL) ×3
ELECTRODE REM PT RTRN 9FT ADLT (ELECTROSURGICAL) ×1 IMPLANT
GAUZE SPONGE 4X4 12PLY STRL (GAUZE/BANDAGES/DRESSINGS) ×3 IMPLANT
GLOVE BIO SURGEON STRL SZ8 (GLOVE) ×6 IMPLANT
GLOVE INDICATOR 8.0 STRL GRN (GLOVE) ×3 IMPLANT
GOWN STRL REUS W/ TWL LRG LVL3 (GOWN DISPOSABLE) ×1 IMPLANT
GOWN STRL REUS W/ TWL XL LVL3 (GOWN DISPOSABLE) ×1 IMPLANT
GOWN STRL REUS W/TWL LRG LVL3 (GOWN DISPOSABLE) ×3
GOWN STRL REUS W/TWL XL LVL3 (GOWN DISPOSABLE) ×3
GRAFT FILLER BONE 5ML (Knees) IMPLANT
IV LACTATED RINGER IRRG 3000ML (IV SOLUTION) ×6
IV LR IRRIG 3000ML ARTHROMATIC (IV SOLUTION) ×2 IMPLANT
KIT ACCUFILL 5CC (Knees) ×1 IMPLANT
KIT KNEE SCP 414.502 (Knees) ×3 IMPLANT
KIT TURNOVER KIT A (KITS) ×3 IMPLANT
MANIFOLD NEPTUNE II (INSTRUMENTS) ×3 IMPLANT
NDL HYPO 21X1.5 SAFETY (NEEDLE) ×1 IMPLANT
NEEDLE HYPO 21X1.5 SAFETY (NEEDLE) ×3 IMPLANT
PACK ARTHROSCOPY KNEE (MISCELLANEOUS) ×3 IMPLANT
PENCIL ELECTRO HAND CTR (MISCELLANEOUS) IMPLANT
SUT PROLENE 4 0 PS 2 18 (SUTURE) IMPLANT
SUT TI-CRON 2-0 W/10 SWGD (SUTURE) IMPLANT
SYR 50ML LL SCALE MARK (SYRINGE) ×3 IMPLANT
TUBING ARTHRO INFLOW-ONLY STRL (TUBING) ×3 IMPLANT
WAND WEREWOLF FLOW 90D (MISCELLANEOUS) ×3 IMPLANT

## 2020-09-09 NOTE — Discharge Instructions (Addendum)
Orthopedic discharge instructions: Keep dressing dry and intact.  May shower after dressing changed on post-op day #4 (Sunday).  Cover sutures with Band-Aids after drying off. Apply ice frequently to knee. Take ibuprofen 400-600 mg TID with meals for 5-7 days, then as necessary.  TID = 3 times per day (every 8 hrs) Take pain medication as prescribed or ES Tylenol when needed.  May weight-bear as tolerated - use crutches or walker as needed. Follow-up in 10-14 days or as scheduled.  AMBULATORY SURGERY  DISCHARGE INSTRUCTIONS   1) The drugs that you were given will stay in your system until tomorrow so for the next 24 hours you should not:  A) Drive an automobile B) Make any legal decisions C) Drink any alcoholic beverage   2) You may resume regular meals tomorrow.  Today it is better to start with liquids and gradually work up to solid foods.  You may eat anything you prefer, but it is better to start with liquids, then soup and crackers, and gradually work up to solid foods.   3) Please notify your doctor immediately if you have any unusual bleeding, trouble breathing, redness and pain at the surgery site, drainage, fever, or pain not relieved by medication.    4) Additional Instructions:        Please contact your physician with any problems or Same Day Surgery at [phone], Monday through Friday 6 am to 4 pm, or [place] at [facility] number at [phone].

## 2020-09-09 NOTE — Anesthesia Procedure Notes (Signed)
Procedure Name: LMA Insertion Date/Time: 09/09/2020 7:37 AM Performed by: Lowry Bowl, CRNA Pre-anesthesia Checklist: Emergency Drugs available, Patient identified, Suction available and Patient being monitored Patient Re-evaluated:Patient Re-evaluated prior to induction Oxygen Delivery Method: Circle system utilized Preoxygenation: Pre-oxygenation with 100% oxygen Induction Type: IV induction Ventilation: Mask ventilation without difficulty LMA: LMA inserted LMA Size: 3.0 Number of attempts: 1 Placement Confirmation: breath sounds checked- equal and bilateral and positive ETCO2 Tube secured with: Tape Dental Injury: Teeth and Oropharynx as per pre-operative assessment

## 2020-09-09 NOTE — Op Note (Signed)
09/09/2020  9:05 AM  Patient:   Tina Frey  Pre-Op Diagnosis:   Complex lateral meniscus tear and insufficiency fracture of lateral tibial plateau with underlying degenerative joint disease and loose body, right knee.  Postoperative diagnosis:   Complex lateral meniscus tear and insufficiency fracture of lateral tibial plateau with underlying degenerative joint disease, loose body, and symptomatic medial shelf plica, right knee.  Procedure:   Arthroscopic debridement of symptomatic plica, arthroscopic excision of loose body, arthroscopic abrasion chondroplasty of medial femoral condyle, arthroscopic partial lateral meniscectomy, and subchondroplasty of lateral tibial plateau, right knee.  Surgeon:   Pascal Lux, M.D.  Anesthesia:   General LMA.  Findings:   As above.  There was a complex tear of the lateral meniscus involving the anterior and middle portions of the meniscus.  The medial meniscus was in satisfactory condition.  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  The anterior posterior cruciate ligaments both were in satisfactory condition.  Complications:   None.  EBL:   5 cc.  Total fluids:   600 cc of crystalloid.  Tourniquet time:   None  Drains:   None  Closure:   4-0 Prolene interrupted sutures.  Brief clinical note:   The patient is a 79 year old female with a history of progressive worsening lateral sided right knee pain. Her symptoms have progressed despite medications, activity modification, etc. Her history and examination are consistent with a lateral meniscus tear with underlying degenerative joint disease. An MRI scan confirmed the presence of these findings, it also demonstrated insufficiency fracture of the lateral tibial plateau as well as a large calcified loose body in the notch. The patient presents at this time for arthroscopy, debridement,  excision of loose body, subchondroplasty of the insufficiency fracture of the lateral tibial plateau, and partial lateral meniscectomy.  Procedure:   The patient was brought into the operating room and lain in the supine position. After adequate general laryngeal mask anesthesia was obtained, a timeout was performed to verify the appropriate side. The patient's right knee was injected sterilely using a solution of 30 cc of 1% lidocaine and 30 cc of 0.5% Sensorcaine with epinephrine. The right lower extremity was prepped with ChloraPrep solution before being draped sterilely. Preoperative antibiotics were administered.   The subchondroplasty portion of the procedure was done first. The expected portal sites and subchondroplasty access site were injected with 0.5% Sensorcaine with epinephrine before a short stab incision was made over the lateral aspect of the lateral tibial plateau after defining the proper site by fluoroscopy. The cannulated introducer was drilled into place under fluoroscopic guidance and the adequacy its position verified fluoroscopically in AP and lateral projections. The subchondroplasty cement was mixed on the back table before a total of 4 cc of the substance was injected into the lateral tibial plateau region, rotating the cannula 90 degrees with each 1 cc syringe in order to better distribute the cement. After the cement hardened, the introducer was removed and the adequacy of fill was verified fluoroscopically in AP and lateral projections and found to be excellent.  Next, the arthroscopic portion of the procedure was begun. The camera was placed in the anterolateral portal and instrumentation performed through the anteromedial portal. The knee was sequentially examined beginning in the suprapatellar pouch, then progressing to the patellofemoral space, the medial gutter and compartment, the notch, and finally the lateral compartment and gutter. The findings  were as described above.  Abundant reactive synovial tissues anteriorly were debrided using the full-radius resector in order to improve visualization. This debridement exposed the presence of a large thickened symptomatic plica that appeared to be abrading the medial edge of the medial femoral condyle, so this was debrided as well. The area of abrasion along the medial edge of the medial femoral condyle was debrided back to stable margins using a full radius resector. Focal areas of chondromalacia involving the weightbearing portion of the medial femoral condyle and the femoral trochlea also were debrided back to stable margins using the full-radius resector. The lateral meniscus was debrided back to stable margins using a combination of the mini munchers, the lateral basket, and the full-radius resector. Subsequent probing the remaining rim demonstrated excellent stability. The instruments were removed from the joint after suctioning the excess fluid.   The portal sites and lateral stab incision were closed using 4-0 Prolene interrupted sutures before a sterile bulky dressing was applied to the knee. The patient was then awakened, extubated, and returned to the recovery room in satisfactory condition after tolerating the procedure well.

## 2020-09-09 NOTE — Transfer of Care (Signed)
Immediate Anesthesia Transfer of Care Note  Patient: Tina Frey  Procedure(s) Performed: RIGHT KNEE ARTHROSCOPY WITH DEBRIDEMENT, REMOVAL OF LOOSE BODY, POSSIBLE SUBCHONDROPLASTY AND POSSIBLE LATERAL MENISCECTOMY. (Right Knee)  Patient Location: PACU  Anesthesia Type:General  Level of Consciousness: awake, drowsy and patient cooperative  Airway & Oxygen Therapy: Patient Spontanous Breathing  Post-op Assessment: Report given to RN and Post -op Vital signs reviewed and stable  Post vital signs: Reviewed and stable  Last Vitals:  Vitals Value Taken Time  BP 149/62 09/09/20 0858  Temp    Pulse 95 09/09/20 0901  Resp 23 09/09/20 0901  SpO2 95 % 09/09/20 0901  Vitals shown include unvalidated device data.  Last Pain:  Vitals:   09/09/20 0628  TempSrc: Tympanic  PainSc: 7       Patients Stated Pain Goal: 3 (52/17/47 1595)  Complications: No complications documented.

## 2020-09-09 NOTE — H&P (Signed)
History of Present Illness: Tina Frey is a 79 y.o. female who presents today for history and physical for right knee arthroscopy with Dr. Roland Rack on 09/09/2020. Patient has been having right knee pain over the past 6 weeks after getting out of bed and suffering a twisting right knee injury. She denies any fall onto the right knee. She did not feel a pop but did note some increased swelling at the time of injury. The patient continues to have ongoing constant right knee pain which is worse with prolonged periods of walking or standing. She has not experienced any catching or locking symptoms but does feel that the right knee will occasionally give out, she does report occasional sharp pain with twisting motions of the right knee. Pain is primarily located on the lateral aspect knee but she does report some medial sided knee pain. Pain score today is a 6 out of 10 she denies any numbness or tingling to the right lower extremity. Denies any repeat injury or trauma affecting the right lower extremity.  Past Medical History: . Allergy  SLIGHT ALLERGIES.  Marland Kitchen Arthritis  . Asthma  . Cataract cortical, senile 2006  . Colitis  . COPD (chronic obstructive pulmonary disease) (CMS-HCC)  . Depression  . GERD (gastroesophageal reflux disease)  . Heart murmur, unspecified ????????????  . Hernia 1990  right  . Hernia 2005  left  . Hyperlipidemia  . Hyperplastic colon polyp 07/19/2016  . Hypertension  . Migraine headache  . Mitral valve prolapse  . Osteoporosis, post-menopausal  . Skin cancer  . 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 Tubular adenoma of colon, unspecified 07/19/2016   Past Surgical History: . ABDOMINAL HYSTERECTOMY 1994  . APPENDECTOMY 2005  . APPENDECTOMY 2005  . arthroscopy of knee 2008  . BREAST EXCISIONAL BIOPSY Right 1982  . CARPAL TUNNEL RELEASE  . CATARACT EXTRACTION 02/2005 (L); 03/2005 (R)  . cataract surgery Bilateral 2006  . CHOLECYSTECTOMY 1989   . CHOLECYSTECTOMY 1989  . cmc joint repair Left 2003  . COLONOSCOPY 07/19/2016  Tubular adenoma of colon/Hyperplastic colon polyp/Repeat 69yrs/MUS  . ENDOSCOPIC CARPAL TUNNEL RELEASE Left 2003  . ENDOSCOPIC CARPAL TUNNEL RELEASE Right 2010  . hammertoe repair 2007  . HERNIA REPAIR 1990 (R); 08/2004 (L)  . HYSTERECTOMY 1994  TOTAL  . INTRO CATH TO MAIN PULMONARY ARTERY/RIGHT HEART 2007  negative  . JOINT REPLACEMENT [id number]  (L) KNEE-2008; (L) SHOULDER-2013  . KNEE ARTHROSCOPY 2008  LEFT  . total knee replacement 2008  . TOTAL SHOULDER REPLACEMENT 2013   Past Family History: . Ovarian cancer Mother  . Cancer Mother  . Coronary Artery Disease (Blocked arteries around heart) Father  ENLARGED HEART   Medications: . albuterol 90 mcg/actuation inhaler Inhale 2 inhalations into the lungs every 6 (six) hours as needed for Wheezing 1 Inhaler 1  . aspirin 81 MG EC tablet Take 81 mg by mouth once daily.  Marland Kitchen atorvastatin (LIPITOR) 20 MG tablet TAKE 1 TABLET(20 MG) BY MOUTH EVERY DAY 90 tablet 1  . biotin 1 mg tablet Take 5,000 mcg by mouth once daily.  . DULoxetine (CYMBALTA) 20 MG DR capsule Take 1 capsule (20 mg total) by mouth once daily 90 capsule 2  . fluticasone propionate (FLONASE) 50 mcg/actuation nasal spray Place 2 sprays into both nostrils once daily 16 g 11  . ibuprofen (MOTRIN) 200 MG tablet Take by mouth Take 400 mg by mouth every 8 (eight) hours as needed.  Marland Kitchen lisinopriL (ZESTRIL)  10 MG tablet TAKE 1 TABLET(10 MG) BY MOUTH EVERY DAY 90 tablet 1  . metoprolol tartrate (LOPRESSOR) 50 MG tablet TAKE 1/2 TABLET(25 MG) BY MOUTH TWICE DAILY 90 tablet 1  . sertraline (ZOLOFT) 50 MG tablet TAKE 1 TABLET(50 MG) BY MOUTH EVERY DAY 90 tablet 1  . traZODone (DESYREL) 50 MG tablet TAKE 1 TABLET(50 MG) BY MOUTH EVERY NIGHT 30 tablet 5  . VIT C/E/ZN/COPPR/LUTEIN/ZEAXAN (PRESERVISION AREDS 2 ORAL) Take 2 tablets by mouth once daily.   No current Epic-ordered facility-administered  medications on file.   Allergies: . Cephalexin Diarrhea  . Ciprofloxacin Diarrhea  . Morphine Vomiting and Nausea And Vomiting  . Sulfa (Sulfonamide Antibiotics) Other (See Comments)  Causes yeast infections  Review of Systems:  A comprehensive 14 point ROS was performed, reviewed by me today, and the pertinent orthopaedic findings are documented in the HPI.  Physical Exam: BP 136/72  Ht 154.9 cm (5\' 1" )  Wt 56.7 kg (125 lb)  LMP (LMP Unknown)  BMI 23.62 kg/m   General: Well developed, well nourished 79 y.o. female in no apparent distress. Normal affect. Normal communication. Patient answers questions appropriately. The patient has a normal gait. There is no antalgic component. There is no hip lurch.   HEENT: Head normocephalic, atraumatic, PERRL.   Abdomen: Soft, non tender, non distended, Bowel sounds present.  Heart: Examination of the heart reveals regular, rate, and rhythm. There is no murmur noted on ascultation. There is a normal apical pulse.  Lungs: Lungs are clear to auscultation. There is no wheeze, rhonchi, or crackles. There is normal expansion of bilateral chest walls.   Right Lower Extremity: Examination of the right lower extremity reveals no bony abnormality, no edema, mild effusion and no ecchymosis. There is a mild valgus abnormality. The patient is moderately tender along the lateral joint line, specifically the lateral tibial plateau, and is mildly tender along the medial joint line. The patient is able to fully extend the right knee is able to flex up to 105 degrees, moderate pain at the extreme of knee flexion. The patient has a positive rotational Mcmurray test. There is mild retropatellar discomfort. The patient has a negative patella stretch test. Mild pseudolaxity with varus stress testing the right knee. The patient has a negative Lachman's test.  Vascular: The patient has a negative Bevelyn Buckles' test bilaterally. The patient had a normal dorsalis pedis  and posterior tibial pulse. There is normal skin warmth. There is normal capillary refill bilaterally.   Neurologic: The patient has a negative straight leg raise. The patient has normal muscle strength testing for the quadriceps, calves, ankle dorsiflexion, ankle plantarflexion, and extensor hallicus longus. The patient has sensation that is intact to light touch. The deep tendon reflexes are normal at the patella and achilles. No clonus is noted.   Imaging: AP, lateral and sunrise views of the right knee were reviewed at todays appointment. These x-rays demonstrate what appears to be a loose body within the right knee capsule. It can be visualized on both the AP and lateral views of the right knee. No significant arthritic changes noted to the right knee. No acute fractures visualized. Some mild subchondral sclerosis along the medial compartment of the right knee.  MRI OF THE RIGHT KNEE WITHOUT CONTRAST  1. Fairlyextensive horizontal tear involving the posterior horn and  mid body region of the lateral meniscus.  2. Intact ligamentous structures.  3. Small subchondral stress fracture involving the lateral tibial  plateau with significant surrounding marrow edema.  4. Tricompartmental degenerative changes most significant at the  patellofemoral joint.  5. Moderate to large joint effusion and mild synovitis.  Moderate-sized Baker's cyst.   Impression: Other tear of lateral meniscus of right knee as current injury. Primary osteoarthritis of right knee Loose body of right knee  Plan:  I discussed with the patient the possibility of undergoing a right knee arthroscopy with debridement, removal of loose body, possible subchondroplasty and possible lateral meniscectomy. I did instruct the patient that I do believe that this would help with her discomfort, but with the underlying arthritic changes that she has she may need to undergo a right total knee replacement in the future. The patient was  instructed on the risk and benefits of the right knee arthroscopy at this time and would like to proceed with the procedure.     H&P reviewed and patient re-examined. No changes.

## 2020-09-09 NOTE — Anesthesia Preprocedure Evaluation (Signed)
Anesthesia Evaluation  Patient identified by MRN, date of birth, ID band Patient awake    Reviewed: Allergy & Precautions, H&P , NPO status , Patient's Chart, lab work & pertinent test results  History of Anesthesia Complications Negative for: history of anesthetic complications  Airway Mallampati: III  TM Distance: >3 FB Neck ROM: full    Dental  (+) Chipped, Partial Lower, Upper Dentures   Pulmonary COPD, former smoker,    Pulmonary exam normal        Cardiovascular Exercise Tolerance: Good hypertension, (-) angina+ CAD  (-) Past MI and (-) DOE Normal cardiovascular exam     Neuro/Psych PSYCHIATRIC DISORDERS  Neuromuscular disease    GI/Hepatic negative GI ROS, Neg liver ROS, neg GERD  ,  Endo/Other  negative endocrine ROS  Renal/GU      Musculoskeletal  (+) Arthritis ,   Abdominal   Peds  Hematology negative hematology ROS (+)   Anesthesia Other Findings Past Medical History: No date: Allergy No date: Arthritis No date: Arthritis No date: Cancer ([place])     Comment:  basal cell on forehead No date: Carotid artery stenosis No date: Cataract No date: COPD (chronic obstructive pulmonary disease) (HCC)     Comment:  patient denies this diagnosis. No date: Heart murmur No date: Hyperlipidemia No date: Hypertension No date: Mitral valve prolapse No date: Scoliosis No date: Wears dentures  Past Surgical History: No date: ABDOMINAL HYSTERECTOMY No date: APPENDECTOMY No date: ARTHROSCOPY KNEE W/ DRILLING     Comment:  patient denies this prior to this admission No date: biopsy lip 1982: BREAST EXCISIONAL BIOPSY; Right     Comment:  NEG No date: BREAST SURGERY; Right     Comment:  biopsy /benigning No date: BUNIONECTOMY     Comment:  right No date: CARDIAC CATHETERIZATION     Comment:  patient does not remember having this done No date: CARDIOVASCULAR STRESS TEST No date: CARPAL TUNNEL RELEASE      Comment:  left No date: CARPAL TUNNEL RELEASE No date: CATARACT EXTRACTION     Comment:  left No date: CHOLECYSTECTOMY No date: cmc joint repair     Comment:  left 07/19/2016: COLONOSCOPY WITH PROPOFOL; N/A     Comment:  Procedure: COLONOSCOPY WITH PROPOFOL;  Surgeon: Lollie Sails, MD;  Location: [facility] ENDOSCOPY;  Service:               Endoscopy;  Laterality: N/A; 10/11/2017: CYSTOSCOPY; N/A     Comment:  Procedure: CYSTOSCOPY;  Surgeon: Bjorn Loser, MD;               Location: [facility] ORS;  Service: Urology;  Laterality: N/A; No date: EYE SURGERY     Comment:  bilateral cataract No date: HERNIA REPAIR     Comment:  bilateral inguinal 2009: JOINT REPLACEMENT; Left     Comment:  knee replacement No date: MOHS SURGERY 11/23/2016: PLANTAR FASCIA RELEASE; Left     Comment:  Procedure: ENDOSCOPIC PLANTAR FASCIA RELEASE;  Surgeon:               [name], DPM;  Location: [place];                Service: Podiatry;  Laterality: Left; 10/11/2017: PUBOVAGINAL SLING; N/A     Comment:  Procedure: PUBO-VAGINAL SLING;  Surgeon: Matilde Sprang,  Nicki Reaper, MD;  Location: [facility] ORS;  Service: Urology;                Laterality: N/A; No date: REPLACEMENT TOTAL KNEE     Comment:  left 2015: right shoulder surgery; Right     Comment:  shoulder replacement 11/23/2016: TARSAL TUNNEL RELEASE; Left     Comment:  Procedure: TARSAL TUNNEL RELEASE;  Surgeon: [name], DPM;  Location: [place];  Service:               Podiatry;  Laterality: Left;  POPLITEAL  BMI    Body Mass Index: 23.06 kg/m      Reproductive/Obstetrics negative OB ROS                             Anesthesia Physical Anesthesia Plan  ASA: III  Anesthesia Plan: General LMA   Post-op Pain Management:    Induction: Intravenous  PONV Risk Score and Plan: Dexamethasone, Ondansetron, Midazolam and Treatment may vary due to age or  medical condition  Airway Management Planned: LMA  Additional Equipment:   Intra-op Plan:   Post-operative Plan: Extubation in OR  Informed Consent: I have reviewed the patients History and Physical, chart, labs and discussed the procedure including the risks, benefits and alternatives for the proposed anesthesia with the patient or authorized representative who has indicated his/her understanding and acceptance.     Dental Advisory Given  Plan Discussed with: Anesthesiologist, CRNA and Surgeon  Anesthesia Plan Comments: (Patient consented for risks of anesthesia including but not limited to:  - adverse reactions to medications - damage to eyes, teeth, lips or other oral mucosa - nerve damage due to positioning  - sore throat or hoarseness - Damage to heart, brain, nerves, lungs, other parts of body or loss of life  Patient voiced understanding.)        Anesthesia Quick Evaluation

## 2020-09-10 ENCOUNTER — Encounter: Payer: Self-pay | Admitting: Surgery

## 2020-09-10 NOTE — Anesthesia Postprocedure Evaluation (Signed)
Anesthesia Post Note  Patient: Tina Frey  Procedure(s) Performed: RIGHT KNEE ARTHROSCOPY WITH DEBRIDEMENT, REMOVAL OF LOOSE BODY, POSSIBLE SUBCHONDROPLASTY AND POSSIBLE LATERAL MENISCECTOMY. (Right Knee)  Patient location during evaluation: PACU Anesthesia Type: General Level of consciousness: awake and alert Pain management: pain level controlled Vital Signs Assessment: post-procedure vital signs reviewed and stable Respiratory status: spontaneous breathing, nonlabored ventilation, respiratory function stable and patient connected to nasal cannula oxygen Cardiovascular status: blood pressure returned to baseline and stable Postop Assessment: no apparent nausea or vomiting Anesthetic complications: no   No complications documented.   Last Vitals:  Vitals:   09/09/20 0940 09/09/20 1000  BP: (!) 153/81 (!) 163/73  Pulse: 92 94  Resp: 18 16  Temp: 36.9 C   SpO2: 95% 100%    Last Pain:  Vitals:   09/09/20 1000  TempSrc:   PainSc: 0-No pain                 Precious Haws Besnik Febus

## 2020-10-08 ENCOUNTER — Telehealth: Payer: Self-pay | Admitting: Student in an Organized Health Care Education/Training Program

## 2020-10-08 NOTE — Telephone Encounter (Signed)
Patient doesn't want to have the right side done and also wants to have the left side taken out (PNS). She has appt on 10-14-20 for the right side. Should she keep this appt to have the left side out? She says she really hasnt gotten much relief.

## 2020-10-08 NOTE — Telephone Encounter (Signed)
Patient informed that we can cancel the right side PNS placement and she can keep the appointment to pull the left on Oct 27. Dr. Holley Raring in agreement with plan.

## 2020-10-14 ENCOUNTER — Ambulatory Visit
Payer: Medicare Other | Attending: Student in an Organized Health Care Education/Training Program | Admitting: Student in an Organized Health Care Education/Training Program

## 2020-10-14 ENCOUNTER — Other Ambulatory Visit: Payer: Self-pay

## 2020-10-14 ENCOUNTER — Encounter: Payer: Self-pay | Admitting: Student in an Organized Health Care Education/Training Program

## 2020-10-14 DIAGNOSIS — M5441 Lumbago with sciatica, right side: Secondary | ICD-10-CM | POA: Diagnosis present

## 2020-10-14 DIAGNOSIS — M5442 Lumbago with sciatica, left side: Secondary | ICD-10-CM

## 2020-10-14 DIAGNOSIS — G8929 Other chronic pain: Secondary | ICD-10-CM | POA: Diagnosis present

## 2020-10-14 DIAGNOSIS — G894 Chronic pain syndrome: Secondary | ICD-10-CM | POA: Diagnosis not present

## 2020-10-14 DIAGNOSIS — M4726 Other spondylosis with radiculopathy, lumbar region: Secondary | ICD-10-CM | POA: Diagnosis not present

## 2020-10-14 DIAGNOSIS — M47816 Spondylosis without myelopathy or radiculopathy, lumbar region: Secondary | ICD-10-CM

## 2020-10-14 MED ORDER — LIDOCAINE HCL 2 % IJ SOLN
INTRAMUSCULAR | Status: AC
  Filled 2020-10-14: qty 20

## 2020-10-14 NOTE — Progress Notes (Signed)
PROVIDER NOTE: Information contained herein reflects review and annotations entered in association with encounter. Interpretation of such information and data should be left to medically-trained personnel. Information provided to patient can be located elsewhere in the medical record under "Patient Instructions". Document created using STT-dictation technology, any transcriptional errors that may result from process are unintentional.    Patient: Tina Frey  Service Category: E/M  Provider: Bilal Lateef, MD  DOB: 01/26/1941  DOS: 10/14/2020  Specialty: Interventional Pain Management  MRN: [id number]  Setting: Ambulatory outpatient  PCP: Anderson, Marshall W, MD  Type: Established Patient    Referring Provider: Lateef, Bilal, MD  Location: Office  Delivery: Face-to-face     HPI  Tina Frey, a 79 y.o. year old female, is here today because of her No primary diagnosis found.. Tina Frey's primary complain today is Back Pain (lower) Last encounter: My last encounter with her was on 10/08/2020. 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 Pain Assessment: Severity of Chronic pain is reported as a 7 /10. Location: Back Left/ . Onset: More than a month ago. Quality: Aching, Discomfort, Sharp, Stabbing, Nagging. Timing: Constant. Modifying factor(s): tylenol, motrin. Vitals:  height is 5' 1" (1.549 m) and weight is 125 lb (56.7 kg). Her temperature is 97.5 F (36.4 C) (abnormal). Her blood pressure is 108/59 (abnormal) and her pulse is 74. Her respiration is 16 and oxygen saturation is 99%.   Reason for encounter: Sprint PNS lead pull   Patient had a left L4  medial branch Sprint PNS lead placed on 08/26/2020.  She follows up today for lead pull.  Unfortunately the patient did not experience any significant analgesic or functional benefit during her stimulation therapy.  Lead removed with tip intact, Sprint PNS representative in room.   ROS  Constitutional: Denies any fever or chills Gastrointestinal: No reported hemesis, hematochezia, vomiting, or acute GI distress Musculoskeletal: Low back pain, left greater than right Neurological: No reported episodes of acute onset apraxia, aphasia, dysarthria, agnosia, amnesia, paralysis, loss of coordination, or loss of consciousness  Medication Review  Biotin, Multiple Vitamins-Minerals, Vitamin D3, acetaminophen, albuterol, aspirin EC, atorvastatin, fluticasone, ibuprofen, lisinopril, metoprolol tartrate, ondansetron, sertraline, traMADol, and traZODone  History Review  Allergy: Tina Frey is allergic to ciprofloxacin, morphine and related, cephalexin, and sulfa antibiotics. Drug: Tina Frey  reports no history of drug use. Alcohol:  reports current alcohol use. Tobacco:  reports that she quit smoking about 16 years ago. Her smoking use included cigarettes. She has a 20.00 pack-year smoking history. She has never used smokeless tobacco. Social: Tina Frey  reports that she quit smoking about 16 years ago. Her smoking use included cigarettes. She has a 20.00 pack-year smoking history. She has never used smokeless tobacco. She reports current alcohol use. She reports that she does not use drugs. Medical:  has a past medical history of Allergy, Arthritis, Arthritis, Cancer (HCC), Carotid artery stenosis, Cataract, COPD (chronic obstructive pulmonary disease) (HCC), Heart murmur, Hyperlipidemia, Hypertension, Mitral valve prolapse, Scoliosis, and Wears dentures. Surgical: Tina Frey  has a past surgical history that includes Abdominal hysterectomy; Appendectomy; Cholecystectomy; Bunionectomy; cmc joint repair;  Carpal tunnel release; Cataract extraction; biopsy lip; Arthroscopy knee w/ drilling; Replacement total knee; Carpal tunnel   release; right shoulder surgery (Right, 2015); Colonoscopy with propofol (N/A, 07/19/2016); Cardiovascular stress test; Tarsal tunnel release (Left, 11/23/2016); Plantar fascia release (Left, 11/23/2016); Hernia repair; Pubovaginal sling (N/A, 10/11/2017); Cystoscopy (N/A, 10/11/2017); Breast excisional biopsy (Right, 1982); Mohs surgery; Breast surgery (Right); Cardiac catheterization; Eye surgery; Joint replacement (Left, 2009); and Knee arthroscopy with subchondroplasty (Right, 09/09/2020). Family: family history includes Cancer in her mother; Heart disease in her brother and father.  Laboratory Chemistry Profile   Renal Lab Results  Component Value Date   BUN 20 10/05/2017   CREATININE 0.65 10/05/2017   GFRAA >60 10/05/2017   GFRNONAA >60 10/05/2017     Hepatic Lab Results  Component Value Date   AST 23 02/24/2016   ALT 17 02/24/2016   ALBUMIN 4.3 02/24/2016   ALKPHOS 65 02/24/2016     Electrolytes Lab Results  Component Value Date   NA 139 10/05/2017   K 4.5 10/05/2017   CL 102 10/05/2017   CALCIUM 9.8 10/05/2017   MG 2.0 02/24/2016     Bone Lab Results  Component Value Date   VD25OH 24.9 (L) 02/24/2016   VD125OH2TOT 91.7 (H) 02/24/2016     Inflammation (CRP: Acute Phase) (ESR: Chronic Phase) Lab Results  Component Value Date   CRP <0.5 02/24/2016   ESRSEDRATE 12 02/24/2016       Note: Above Lab results reviewed.  Recent Imaging Review  DG C-Arm 1-60 Min CLINICAL DATA:  Subchondroplasty right knee  EXAM: RIGHT KNEE - 1-2 VIEW; DG C-ARM 1-60 MIN  COMPARISON:  None.  FINDINGS: Intraoperative images demonstrate changes of subchondroplasty within the lateral tibial plateau and proximal metaphysis. No visible complicating feature.  IMPRESSION: Intraoperative imaging as above.  Electronically Signed   By: Rolm Baptise M.D.   On:  09/09/2020 08:48 DG Knee 1-2 Views Right CLINICAL DATA:  Subchondroplasty right knee  EXAM: RIGHT KNEE - 1-2 VIEW; DG C-ARM 1-60 MIN  COMPARISON:  None.  FINDINGS: Intraoperative images demonstrate changes of subchondroplasty within the lateral tibial plateau and proximal metaphysis. No visible complicating feature.  IMPRESSION: Intraoperative imaging as above.  Electronically Signed   By: Rolm Baptise M.D.   On: 09/09/2020 08:48 Note: Reviewed        Physical Exam  General appearance: Well nourished, well developed, and well hydrated. In no apparent acute distress Mental status: Alert, oriented x 3 (person, place, & time)       Respiratory: No evidence of acute respiratory distress Eyes: PERLA Vitals: BP (!) 108/59   Pulse 74   Temp (!) 97.5 F (36.4 C)   Resp 16   Ht 5' 1" (1.549 m)   Wt 125 lb (56.7 kg)   SpO2 99%   BMI 23.62 kg/m  BMI: Estimated body mass index is 23.62 kg/m as calculated from the following:   Height as of this encounter: 5' 1" (1.549 m).   Weight as of this encounter: 125 lb (56.7 kg). Ideal: Ideal body weight: 47.8 kg (105 lb 6.1 oz) Adjusted ideal body weight: 51.4 kg (113 lb 3.7 oz)   Positive pain with facet loading, Sprint PNS lead removed, tip intact, no erythema, redness noted. 5 out of 5 strength bilateral lower extremity: Plantar flexion, dorsiflexion, knee flexion, knee extension.   Assessment   Status Diagnosis  Persistent Persistent Persistent 1. Chronic bilateral low back pain with bilateral sciatica   2. Lumbar spondylosis   3. Lumbar facet syndrome (L>R)   4. Lumbar facet hypertrophy (L2-3, L3-4, L4-L5, L5-S1)   5.  Chronic pain syndrome      Updated Problems: Problem  Lumbar facet hypertrophy (L2-3, L3-4, L4-L5, L5-S1)  Lumbar disc bulging (L2-3, L3-4, and L4-5)  Lumbar disc herniation  (Right) (L1-2: Small right-sided disc protrusion with upgoing disc material)  Lumbar facet syndrome (L>R)  Lumbosacral  radiculopathy (Left) (S1 Dermatome)  Lumbar spondylosis  Chronic Low Back Pain    Plan of Care   Unfortunately, patient has failed spinal injections, physical therapy, medication management and most recently left L4 peripheral nerve stimulation with sprint.  I do not have any other options for her from an interventional standpoint.  Encouraged to continue stretching exercises for low back pain and medication management with PCP.  I do not think the patient would be a good candidate for a dorsal column spinal cord stimulator trial she has significant scoliosis and does not have appendicular pain.  Follow-up plan:   Return if symptoms worsen or fail to improve.     Left L4 peripheral nerve stimulation with sprint on 08/26/2020: Not effective removed 10/14/2020.    Recent Visits Date Type Provider Dept  08/26/20 Procedure visit Gillis Santa, MD [facility]  07/23/20 Office Visit Gillis Santa, MD [facility]  Showing recent visits within past 90 days and meeting all other requirements Today's Visits Date Type Provider Dept  10/14/20 Procedure visit Gillis Santa, MD [facility]  Showing today's visits and meeting all other requirements Future Appointments No visits were found meeting these conditions. Showing future appointments within next 90 days and meeting all other requirements  I discussed the assessment and treatment plan with the patient. The patient was provided an opportunity to ask questions and all were answered. The patient agreed with the plan and demonstrated an understanding of the instructions.  Patient advised to call back or seek an in-person evaluation if the symptoms or condition worsens.  Duration of encounter: 68mnutes.  Note by: BGillis Santa MD Date: 10/14/2020; Time: 8:40 AM

## 2020-10-14 NOTE — Progress Notes (Signed)
Safety precautions to be maintained throughout the outpatient stay will include: orient to surroundings, keep bed in low position, maintain call bell within reach at all times, provide assistance with transfer out of bed and ambulation.  

## 2021-06-07 ENCOUNTER — Other Ambulatory Visit: Payer: Self-pay | Admitting: Surgery

## 2021-06-28 ENCOUNTER — Encounter
Admission: RE | Admit: 2021-06-28 | Discharge: 2021-06-28 | Disposition: A | Discharge status: Home / Self Care | Payer: Medicare Other | Source: Ambulatory Visit | Attending: Surgery | Admitting: Surgery

## 2021-06-28 ENCOUNTER — Other Ambulatory Visit: Payer: Self-pay

## 2021-06-28 ENCOUNTER — Inpatient Hospital Stay
Admission: RE | Admit: 2021-06-28 | Discharge: 2021-06-28 | Disposition: A | Discharge status: Home / Self Care | Payer: Medicare Other | Source: Ambulatory Visit

## 2021-06-28 NOTE — Progress Notes (Signed)
  Perioperative Services Pre-Admission/Anesthesia Testing     Date: 06/28/21  Name: CORLISS COGGESHALL MRN:   [id number]  Re: Change in [place] for upcoming surgery   Case: [id number] Date/Time: 07/08/21 0715   Procedure: TOTAL KNEE ARTHROPLASTY (Right: Knee)   Anesthesia type: Choice   Pre-op diagnosis: PRIMARY OSTEOARTHRITIS OF RIGHT KNEE.   Location: [facility] OR ROOM 03 / [place] ORS FOR ANESTHESIA GROUP   Surgeons: Corky Mull, MD     Primary attending surgeon was consulted regarding consideration of therapeutic change in antimicrobial agent being used for preoperative prophylaxis in this patient's upcoming surgical case. Following analysis of the risk versus benefits, Dr. Roland Rack, Marshall Cork, MD advising that it would be acceptable to discontinue the ordered clindamycin and place an order for cefazolin 2 gm IV on call to the OR. Orders for this patient were amended by me following collaborative conversation with attending surgeon.  Honor Loh, MSN, APRN, FNP-C, CEN [facility]  Peri-operative Services Nurse Practitioner Phone: [phone] 06/28/21 1:55 PM

## 2021-06-28 NOTE — Patient Instructions (Signed)
Your procedure is scheduled on: 07/08/21 Report to [place]. To find out your arrival time please call [phone] between 1PM - 3PM on 07/07/21.  Remember: Instructions that are not followed completely may result in serious medical risk, up to and including death, or upon the discretion of your surgeon and anesthesiologist your surgery may need to be rescheduled.     _X__ 1. Do not eat food after midnight the night before your procedure.                 No gum chewing or hard candies. You may drink clear liquids up to 2 hours                 before you are scheduled to arrive for your surgery- DO not drink clear                 liquids within 2 hours of the start of your surgery.                 Clear Liquids include:  water, apple juice without pulp, clear carbohydrate                 drink such as Clearfast or Gatorade, Black Coffee or Tea (Do not add                 anything to coffee or tea). Diabetics water only  Drink Ensure "Clear" Pre Surgery drink 2 hours before arriving  __X__2.  On the morning of surgery brush your teeth with toothpaste and water, you                 may rinse your mouth with mouthwash if you wish.  Do not swallow any              toothpaste of mouthwash.     _X__ 3.  No Alcohol for 24 hours before or after surgery.   _X__ 4.  Do Not Smoke or use e-cigarettes For 24 Hours Prior to Your Surgery.                 Do not use any chewable tobacco products for at least 6 hours prior to                 surgery.  ____  5.  Bring all medications with you on the day of surgery if instructed.   __X__  6.  Notify your doctor if there is any change in your medical condition      (cold, fever, infections).     Do not wear jewelry, make-up, hairpins, clips or nail polish. Do not wear lotions, powders, or perfumes.  Do not shave 48 hours prior to surgery. Men may shave face and neck. Do not bring valuables to the  hospital.    [facility] is not responsible for any belongings or valuables.  Contacts, dentures/partials or body piercings may not be worn into surgery. Bring a case for your contacts, glasses or hearing aids, a denture cup will be supplied. Leave your suitcase in the car. After surgery it may be brought to your room. For patients admitted to the hospital, discharge time is determined by your treatment team.   Patients discharged the day of surgery will not be allowed to drive home.   Please read over the following fact sheets that you were given:   MRSA Information, CHG soap, Ensure, Incentive Spirometer  __X__ Take these medicines the morning of surgery with A SIP OF WATER:    1. atorvastatin (LIPITOR) 20 MG tablet  2. metoprolol tartrate (LOPRESSOR) 50 MG tablet  3. sertraline (ZOLOFT) 50 MG tabl  4.  5.  6.  ____ Fleet Enema (as directed)   __X__ Use CHG Soap/SAGE wipes as directed  ____ Use inhalers on the day of surgery  ____ Stop metformin/Janumet/Farxiga 2 days prior to surgery    ____ Take 1/2 of usual insulin dose the night before surgery. No insulin the morning          of surgery.   ____ Stop Blood Thinners Coumadin/Plavix/Xarelto/Pleta/Pradaxa/Eliquis/Effient/Aspirin  on   Or contact your Surgeon, Cardiologist or Medical Doctor regarding  ability to stop your blood thinners  __X__ Stop Anti-inflammatories 7 days before surgery such as Advil, Ibuprofen, Motrin,  BC or Goodies Powder, Naprosyn, Naproxen, Aleve, Aspirin    __X__ Stop all herbal supplements, fish oil or vitamin E until after surgery.    ____ Bring C-Pap to the hospital.    Stop aspirin as instructed by surgeon

## 2021-06-28 NOTE — Progress Notes (Signed)
  Perioperative Services Pre-Admission/Anesthesia Testing   Date: 06/28/21 Name: Tina Frey MRN:   [id number]  Re: Consideration of preoperative prophylactic antibiotic change   Request sent to: Poggi, Marshall Cork, MD (routed and/or faxed via [employer])  Planned Surgical Procedure(s):    Case: [id number] Date/Time: 07/08/21 0715   Procedure: TOTAL KNEE ARTHROPLASTY (Right: Knee)   Anesthesia type: Choice   Pre-op diagnosis: PRIMARY OSTEOARTHRITIS OF RIGHT KNEE.   Location: [facility] OR ROOM 03 / [place] ORS FOR ANESTHESIA GROUP   Surgeons: Corky Mull, MD     Notes: Patient has no documented allergy to PCN, but an intolerance to cephalexin Advising that CEPHALEXIN has caused her to experience nausea in the past.   Screened as appropriate for cephalosporin use during medication reconciliation No immediate angioedema, dysphagia, SOB, anaphylaxis symptoms. No severe rash involving mucous membranes or skin necrosis. No hospital admissions related to side effects of PCN/cephalosporin use.  No documented reaction to PCN or cephalosporin in the last 10 years.  Request:  As an evidence based approach to reducing the rate of incidence for post-operative SSI and the development of MDROs, could an agent with narrower coverage for preoperative prophylaxis in this patient's upcoming surgical course be considered?   Currently ordered preoperative prophylactic ABX: clindamycin.   Specifically requesting change to cephalosporin (CEFAZOLIN).   Please communicate decision with me and I will change the orders in Epic as per your direction.    Honor Loh, MSN, APRN, FNP-C, CEN [facility]  Peri-operative Services Nurse Practitioner FAX: [phone] 06/28/21 11:56 AM

## 2021-06-30 ENCOUNTER — Encounter
Admission: RE | Admit: 2021-06-30 | Discharge: 2021-06-30 | Disposition: A | Discharge status: Home / Self Care | Payer: Medicare Other | Source: Ambulatory Visit | Attending: Surgery | Admitting: Surgery

## 2021-06-30 DIAGNOSIS — Z01812 Encounter for preprocedural laboratory examination: Secondary | ICD-10-CM | POA: Diagnosis present

## 2021-06-30 LAB — TYPE AND SCREEN
ABO/RH(D): A POS
Antibody Screen: NEGATIVE

## 2021-06-30 LAB — CBC WITH DIFFERENTIAL/PLATELET
Abs Immature Granulocytes: 0.01 10*3/uL (ref 0.00–0.07)
Basophils Absolute: 0 10*3/uL (ref 0.0–0.1)
Basophils Relative: 0 %
Eosinophils Absolute: 0.1 10*3/uL (ref 0.0–0.5)
Eosinophils Relative: 1 %
HCT: 35.7 % — ABNORMAL LOW (ref 36.0–46.0)
Hemoglobin: 12 g/dL (ref 12.0–15.0)
Immature Granulocytes: 0 %
Lymphocytes Relative: 30 %
Lymphs Abs: 1.5 10*3/uL (ref 0.7–4.0)
MCH: 32 pg (ref 26.0–34.0)
MCHC: 33.6 g/dL (ref 30.0–36.0)
MCV: 95.2 fL (ref 80.0–100.0)
Monocytes Absolute: 0.7 10*3/uL (ref 0.1–1.0)
Monocytes Relative: 14 %
Neutro Abs: 2.6 10*3/uL (ref 1.7–7.7)
Neutrophils Relative %: 55 %
Platelets: 181 10*3/uL (ref 150–400)
RBC: 3.75 MIL/uL — ABNORMAL LOW (ref 3.87–5.11)
RDW: 12.9 % (ref 11.5–15.5)
WBC: 4.9 10*3/uL (ref 4.0–10.5)
nRBC: 0 % (ref 0.0–0.2)

## 2021-06-30 LAB — SURGICAL PCR SCREEN
MRSA, PCR: NEGATIVE
Staphylococcus aureus: NEGATIVE

## 2021-06-30 LAB — URINALYSIS, ROUTINE W REFLEX MICROSCOPIC
Bilirubin Urine: NEGATIVE
Glucose, UA: NEGATIVE mg/dL
Hgb urine dipstick: NEGATIVE
Ketones, ur: NEGATIVE mg/dL
Nitrite: NEGATIVE
Protein, ur: NEGATIVE mg/dL
Specific Gravity, Urine: 1.035 — ABNORMAL HIGH (ref 1.005–1.030)
pH: 5 (ref 5.0–8.0)

## 2021-07-01 ENCOUNTER — Other Ambulatory Visit: Admission: RE | Admit: 2021-07-01 | Payer: Medicare Other | Source: Ambulatory Visit

## 2021-07-02 LAB — URINE CULTURE: Culture: <10000 — AB

## 2021-07-06 ENCOUNTER — Other Ambulatory Visit
Admission: RE | Admit: 2021-07-06 | Discharge: 2021-07-06 | Disposition: A | Discharge status: Home / Self Care | Payer: Medicare Other | Source: Ambulatory Visit | Attending: Surgery | Admitting: Surgery

## 2021-07-06 ENCOUNTER — Other Ambulatory Visit: Payer: Self-pay

## 2021-07-06 DIAGNOSIS — Z20822 Contact with and (suspected) exposure to covid-19: Secondary | ICD-10-CM | POA: Diagnosis not present

## 2021-07-06 DIAGNOSIS — Z01812 Encounter for preprocedural laboratory examination: Secondary | ICD-10-CM | POA: Diagnosis present

## 2021-07-06 LAB — SARS CORONAVIRUS 2 (TAT 6-24 HRS): SARS Coronavirus 2: NEGATIVE

## 2021-07-07 MED ORDER — FAMOTIDINE 20 MG PO TABS
20.0000 mg | ORAL_TABLET | Freq: Once | ORAL | Status: AC
  Administered 2021-07-08: 20 mg via ORAL

## 2021-07-07 MED ORDER — ORAL CARE MOUTH RINSE: 15.0000 mL | Freq: Once | OROMUCOSAL | Status: AC

## 2021-07-07 MED ORDER — CHLORHEXIDINE GLUCONATE 0.12 % MT SOLN
15.0000 mL | Freq: Once | OROMUCOSAL | Status: AC
  Administered 2021-07-08: 15 mL via OROMUCOSAL

## 2021-07-07 MED ORDER — LACTATED RINGERS IV SOLN: INTRAVENOUS | Status: DC

## 2021-07-07 MED ORDER — CEFAZOLIN SODIUM-DEXTROSE 2-4 GM/100ML-% IV SOLN
2.0000 g | Freq: Once | INTRAVENOUS | Status: AC
  Administered 2021-07-08: 2 g via INTRAVENOUS

## 2021-07-08 ENCOUNTER — Inpatient Hospital Stay: Payer: Medicare Other | Admitting: Urgent Care

## 2021-07-08 ENCOUNTER — Other Ambulatory Visit: Payer: Self-pay

## 2021-07-08 ENCOUNTER — Inpatient Hospital Stay: Payer: Medicare Other

## 2021-07-08 ENCOUNTER — Ambulatory Visit
Admission: RE | Admit: 2021-07-08 | Discharge: 2021-07-08 | Disposition: A | Discharge status: Home / Self Care | Payer: Medicare Other | Attending: Surgery | Admitting: Surgery

## 2021-07-08 ENCOUNTER — Encounter
Admission: RE | Disposition: A | Discharge status: Home / Self Care | Payer: Self-pay | Source: Home / Self Care | Attending: Surgery

## 2021-07-08 ENCOUNTER — Encounter: Payer: Self-pay | Admitting: Surgery

## 2021-07-08 DIAGNOSIS — Z96652 Presence of left artificial knee joint: Secondary | ICD-10-CM | POA: Diagnosis not present

## 2021-07-08 DIAGNOSIS — Z85828 Personal history of other malignant neoplasm of skin: Secondary | ICD-10-CM | POA: Insufficient documentation

## 2021-07-08 DIAGNOSIS — M23200 Derangement of unspecified lateral meniscus due to old tear or injury, right knee: Secondary | ICD-10-CM | POA: Insufficient documentation

## 2021-07-08 DIAGNOSIS — Z7982 Long term (current) use of aspirin: Secondary | ICD-10-CM | POA: Diagnosis not present

## 2021-07-08 DIAGNOSIS — M2341 Loose body in knee, right knee: Secondary | ICD-10-CM | POA: Insufficient documentation

## 2021-07-08 DIAGNOSIS — Z882 Allergy status to sulfonamides status: Secondary | ICD-10-CM | POA: Diagnosis not present

## 2021-07-08 DIAGNOSIS — X58XXXA Exposure to other specified factors, initial encounter: Secondary | ICD-10-CM | POA: Diagnosis not present

## 2021-07-08 DIAGNOSIS — Z881 Allergy status to other antibiotic agents status: Secondary | ICD-10-CM | POA: Insufficient documentation

## 2021-07-08 DIAGNOSIS — M1711 Unilateral primary osteoarthritis, right knee: Secondary | ICD-10-CM | POA: Insufficient documentation

## 2021-07-08 DIAGNOSIS — Z79899 Other long term (current) drug therapy: Secondary | ICD-10-CM | POA: Diagnosis not present

## 2021-07-08 DIAGNOSIS — Z96651 Presence of right artificial knee joint: Secondary | ICD-10-CM

## 2021-07-08 DIAGNOSIS — Z885 Allergy status to narcotic agent status: Secondary | ICD-10-CM | POA: Insufficient documentation

## 2021-07-08 HISTORY — PX: TOTAL KNEE ARTHROPLASTY: SHX125

## 2021-07-08 LAB — ABO/RH: ABO/RH(D): A POS

## 2021-07-08 SURGERY — ARTHROPLASTY, KNEE, TOTAL
Anesthesia: Spinal | Site: Knee | Laterality: Right

## 2021-07-08 MED ORDER — TRANEXAMIC ACID 1000 MG/10ML IV SOLN
INTRAVENOUS | Status: DC | PRN
  Administered 2021-07-08: 1000 mg via TOPICAL

## 2021-07-08 MED ORDER — BUPIVACAINE HCL (PF) 0.5 % IJ SOLN
INTRAMUSCULAR | Status: DC | PRN
  Administered 2021-07-08: 2.5 mL

## 2021-07-08 MED ORDER — ONDANSETRON HCL 4 MG/2ML IJ SOLN
INTRAMUSCULAR | Status: DC | PRN
  Administered 2021-07-08: 4 mg via INTRAVENOUS

## 2021-07-08 MED ORDER — ACETAMINOPHEN 10 MG/ML IV SOLN
INTRAVENOUS | Status: AC
  Filled 2021-07-08: qty 100

## 2021-07-08 MED ORDER — CEFAZOLIN SODIUM-DEXTROSE 2-4 GM/100ML-% IV SOLN
INTRAVENOUS | Status: AC
  Filled 2021-07-08: qty 100

## 2021-07-08 MED ORDER — SODIUM CHLORIDE FLUSH 0.9 % IV SOLN
INTRAVENOUS | Status: AC
  Filled 2021-07-08: qty 40

## 2021-07-08 MED ORDER — PROPOFOL 1000 MG/100ML IV EMUL
INTRAVENOUS | Status: AC
  Filled 2021-07-08: qty 100

## 2021-07-08 MED ORDER — SODIUM CHLORIDE 0.9 % IV SOLN
INTRAVENOUS | Status: DC | PRN
  Administered 2021-07-08: 60 mL

## 2021-07-08 MED ORDER — HYDROCODONE-ACETAMINOPHEN 5-325 MG PO TABS: 1.0000 | ORAL_TABLET | Freq: Four times a day (QID) | ORAL | 0 refills | Status: AC | PRN

## 2021-07-08 MED ORDER — KETOROLAC TROMETHAMINE 15 MG/ML IJ SOLN
15.0000 mg | Freq: Once | INTRAMUSCULAR | Status: AC
  Administered 2021-07-08: 15 mg via INTRAVENOUS

## 2021-07-08 MED ORDER — ACETAMINOPHEN 10 MG/ML IV SOLN
INTRAVENOUS | Status: DC | PRN
  Administered 2021-07-08: 1000 mg via INTRAVENOUS

## 2021-07-08 MED ORDER — ONDANSETRON HCL 4 MG/2ML IJ SOLN: 4.0000 mg | Freq: Four times a day (QID) | INTRAMUSCULAR | Status: DC | PRN

## 2021-07-08 MED ORDER — BUPIVACAINE HCL (PF) 0.5 % IJ SOLN
INTRAMUSCULAR | Status: AC
  Filled 2021-07-08: qty 10

## 2021-07-08 MED ORDER — 0.9 % SODIUM CHLORIDE (POUR BTL) OPTIME
TOPICAL | Status: DC | PRN
  Administered 2021-07-08: 1000 mL

## 2021-07-08 MED ORDER — PHENYLEPHRINE HCL (PRESSORS) 10 MG/ML IV SOLN
INTRAVENOUS | Status: DC | PRN
Start: 2021-07-08 — End: 2021-07-08
  Administered 2021-07-08: 100 ug via INTRAVENOUS

## 2021-07-08 MED ORDER — SODIUM CHLORIDE 0.9 % IV SOLN: INTRAVENOUS | Status: DC

## 2021-07-08 MED ORDER — HYDROCODONE-ACETAMINOPHEN 5-325 MG PO TABS
ORAL_TABLET | ORAL | Status: AC
  Filled 2021-07-08: qty 1

## 2021-07-08 MED ORDER — KETOROLAC TROMETHAMINE 15 MG/ML IJ SOLN: 7.5000 mg | Freq: Four times a day (QID) | INTRAMUSCULAR | Status: DC

## 2021-07-08 MED ORDER — TRANEXAMIC ACID 1000 MG/10ML IV SOLN
INTRAVENOUS | Status: AC
  Filled 2021-07-08: qty 10

## 2021-07-08 MED ORDER — BUPIVACAINE-EPINEPHRINE (PF) 0.5% -1:200000 IJ SOLN
INTRAMUSCULAR | Status: AC
  Filled 2021-07-08: qty 30

## 2021-07-08 MED ORDER — SODIUM CHLORIDE 0.9 % IV BOLUS
250.0000 mL | Freq: Once | INTRAVENOUS | Status: AC
  Administered 2021-07-08: 250 mL via INTRAVENOUS

## 2021-07-08 MED ORDER — PROPOFOL 10 MG/ML IV BOLUS
INTRAVENOUS | Status: DC | PRN
Start: 2021-07-08 — End: 2021-07-08
  Administered 2021-07-08: 20 mg via INTRAVENOUS
  Administered 2021-07-08: 10 mg via INTRAVENOUS

## 2021-07-08 MED ORDER — BUPIVACAINE-EPINEPHRINE (PF) 0.5% -1:200000 IJ SOLN
INTRAMUSCULAR | Status: DC | PRN
  Administered 2021-07-08: 30 mL

## 2021-07-08 MED ORDER — PROPOFOL 500 MG/50ML IV EMUL
INTRAVENOUS | Status: DC | PRN
  Administered 2021-07-08 (×2): 75 ug/kg/min via INTRAVENOUS

## 2021-07-08 MED ORDER — PHENYLEPHRINE HCL (PRESSORS) 10 MG/ML IV SOLN
INTRAVENOUS | Status: AC
  Filled 2021-07-08: qty 1

## 2021-07-08 MED ORDER — SODIUM CHLORIDE 0.9 % IV SOLN
INTRAVENOUS | Status: DC | PRN
  Administered 2021-07-08 (×2): 15 ug/min via INTRAVENOUS

## 2021-07-08 MED ORDER — ACETAMINOPHEN 500 MG PO TABS: 500.0000 mg | ORAL_TABLET | Freq: Four times a day (QID) | ORAL | Status: DC

## 2021-07-08 MED ORDER — LIDOCAINE HCL (PF) 2 % IJ SOLN
INTRAMUSCULAR | Status: AC
  Filled 2021-07-08: qty 5

## 2021-07-08 MED ORDER — FENTANYL CITRATE (PF) 100 MCG/2ML IJ SOLN: 25.0000 ug | INTRAMUSCULAR | Status: DC | PRN

## 2021-07-08 MED ORDER — CHLORHEXIDINE GLUCONATE 0.12 % MT SOLN
OROMUCOSAL | Status: AC
  Filled 2021-07-08: qty 15

## 2021-07-08 MED ORDER — FENTANYL CITRATE (PF) 100 MCG/2ML IJ SOLN
INTRAMUSCULAR | Status: AC
  Filled 2021-07-08: qty 2

## 2021-07-08 MED ORDER — APIXABAN 2.5 MG PO TABS: 2.5000 mg | ORAL_TABLET | Freq: Two times a day (BID) | ORAL | 0 refills | Status: AC

## 2021-07-08 MED ORDER — KETOROLAC TROMETHAMINE 15 MG/ML IJ SOLN
INTRAMUSCULAR | Status: AC
  Filled 2021-07-08: qty 1

## 2021-07-08 MED ORDER — FAMOTIDINE 20 MG PO TABS
ORAL_TABLET | ORAL | Status: AC
  Filled 2021-07-08: qty 1

## 2021-07-08 MED ORDER — ONDANSETRON HCL 4 MG/2ML IJ SOLN
INTRAMUSCULAR | Status: AC
  Filled 2021-07-08: qty 2

## 2021-07-08 MED ORDER — CEFAZOLIN SODIUM-DEXTROSE 2-4 GM/100ML-% IV SOLN
2.0000 g | Freq: Four times a day (QID) | INTRAVENOUS | Status: DC
Start: 2021-07-08 — End: 2021-07-08
  Administered 2021-07-08: 2 g via INTRAVENOUS

## 2021-07-08 MED ORDER — FENTANYL CITRATE (PF) 100 MCG/2ML IJ SOLN
INTRAMUSCULAR | Status: DC | PRN
  Administered 2021-07-08 (×2): 25 ug via INTRAVENOUS
  Administered 2021-07-08: 12.5 ug via INTRAVENOUS
  Administered 2021-07-08: 25 ug via INTRAVENOUS
  Administered 2021-07-08: 12.5 ug via INTRAVENOUS

## 2021-07-08 MED ORDER — ONDANSETRON HCL 4 MG PO TABS: 4.0000 mg | ORAL_TABLET | Freq: Four times a day (QID) | ORAL | Status: DC | PRN

## 2021-07-08 MED ORDER — METOCLOPRAMIDE HCL 5 MG/ML IJ SOLN: 5.0000 mg | Freq: Three times a day (TID) | INTRAMUSCULAR | Status: DC | PRN

## 2021-07-08 MED ORDER — METOCLOPRAMIDE HCL 10 MG PO TABS: 5.0000 mg | ORAL_TABLET | Freq: Three times a day (TID) | ORAL | Status: DC | PRN

## 2021-07-08 MED ORDER — HYDROCODONE-ACETAMINOPHEN 5-325 MG PO TABS
1.0000 | ORAL_TABLET | ORAL | Status: DC | PRN
  Administered 2021-07-08: 1 via ORAL

## 2021-07-08 MED ORDER — SODIUM CHLORIDE 0.9 % IR SOLN
Status: DC | PRN
  Administered 2021-07-08: 3000 mL

## 2021-07-08 SURGICAL SUPPLY — 63 items
APL PRP STRL LF DISP 70% ISPRP (MISCELLANEOUS) ×1
BIT DRILL QUICK REL 1/8 2PK SL (DRILL) ×1 IMPLANT
BLADE SAW SAG 25X90X1.19 (BLADE) ×2 IMPLANT
BLADE SURG SZ20 CARB STEEL (BLADE) ×2 IMPLANT
BNDG CMPR STD VLCR NS LF 5.8X6 (GAUZE/BANDAGES/DRESSINGS) ×1
BNDG ELASTIC 6X5.8 VLCR NS LF (GAUZE/BANDAGES/DRESSINGS) ×2 IMPLANT
BRNG TIB 67X10 ANT STAB MDLR (Insert) ×1 IMPLANT
CEMENT BONE R 1X40 (Cement) ×4 IMPLANT
CEMENT VACUUM MIXING SYSTEM (MISCELLANEOUS) ×2 IMPLANT
CHLORAPREP W/TINT 26 (MISCELLANEOUS) ×2 IMPLANT
COMP PAT 3 PEG SERIES A 31/6.2 (Miscellaneous) ×2 IMPLANT
COMPONENT PAT3 PEG SERS 31/6.2 (Miscellaneous) ×1 IMPLANT
COOLER POLAR GLACIER W/PUMP (MISCELLANEOUS) ×2 IMPLANT
COVER MAYO STAND REUSABLE (DRAPES) ×2 IMPLANT
CUFF TOURN SGL QUICK 24 (TOURNIQUET CUFF)
CUFF TOURN SGL QUICK 34 (TOURNIQUET CUFF)
CUFF TRNQT CYL 24X4X16.5-23 (TOURNIQUET CUFF) IMPLANT
CUFF TRNQT CYL 34X4.125X (TOURNIQUET CUFF) IMPLANT
DRAPE 3/4 80X56 (DRAPES) ×2 IMPLANT
DRAPE IMP U-DRAPE 54X76 (DRAPES) ×2 IMPLANT
DRILL QUICK RELEASE 1/8 INCH (DRILL) ×1
DRSG MEPILEX SACRM 8.7X9.8 (GAUZE/BANDAGES/DRESSINGS) IMPLANT
DRSG OPSITE POSTOP 4X10 (GAUZE/BANDAGES/DRESSINGS) IMPLANT
DRSG OPSITE POSTOP 4X8 (GAUZE/BANDAGES/DRESSINGS) ×2 IMPLANT
ELECT REM PT RETURN 9FT ADLT (ELECTROSURGICAL) ×2
ELECTRODE REM PT RTRN 9FT ADLT (ELECTROSURGICAL) ×1 IMPLANT
GAUZE 4X4 16PLY ~~LOC~~+RFID DBL (SPONGE) ×2 IMPLANT
GAUZE XEROFORM 1X8 LF (GAUZE/BANDAGES/DRESSINGS) ×2 IMPLANT
GLOVE SRG 8 PF TXTR STRL LF DI (GLOVE) ×1 IMPLANT
GLOVE SURG ENC MOIS LTX SZ7.5 (GLOVE) ×8 IMPLANT
GLOVE SURG ENC MOIS LTX SZ8 (GLOVE) ×8 IMPLANT
GLOVE SURG UNDER LTX SZ8 (GLOVE) ×2 IMPLANT
GLOVE SURG UNDER POLY LF SZ8 (GLOVE) ×2
GOWN STRL REUS W/ TWL LRG LVL3 (GOWN DISPOSABLE) ×3 IMPLANT
GOWN STRL REUS W/ TWL XL LVL3 (GOWN DISPOSABLE) ×1 IMPLANT
GOWN STRL REUS W/TWL LRG LVL3 (GOWN DISPOSABLE) ×6
GOWN STRL REUS W/TWL XL LVL3 (GOWN DISPOSABLE) ×2
HOOD PEEL AWAY FLYTE STAYCOOL (MISCELLANEOUS) ×8 IMPLANT
INSERT TIB BEARING 67X10 (Insert) ×2 IMPLANT
IV NS IRRIG 3000ML ARTHROMATIC (IV SOLUTION) ×2 IMPLANT
KIT TURNOVER KIT A (KITS) ×2 IMPLANT
KNEE FEMERAL CR RT 60 (Femur) ×2 IMPLANT
MANIFOLD NEPTUNE II (INSTRUMENTS) ×2 IMPLANT
NEEDLE SPNL 20GX3.5 QUINCKE YW (NEEDLE) ×2 IMPLANT
NS IRRIG 1000ML POUR BTL (IV SOLUTION) ×2 IMPLANT
PACK TOTAL KNEE (MISCELLANEOUS) ×2 IMPLANT
PAD WRAPON POLAR KNEE (MISCELLANEOUS) ×1 IMPLANT
PENCIL SMOKE EVACUATOR (MISCELLANEOUS) ×2 IMPLANT
PLATE INTERLOK 6700 (Plate) ×2 IMPLANT
PULSAVAC PLUS IRRIG FAN TIP (DISPOSABLE) ×2
SPONGE T-LAP 18X18 ~~LOC~~+RFID (SPONGE) ×6 IMPLANT
STAPLER SKIN PROX 35W (STAPLE) ×2 IMPLANT
SUCTION FRAZIER HANDLE 10FR (MISCELLANEOUS) ×1
SUCTION TUBE FRAZIER 10FR DISP (MISCELLANEOUS) ×1 IMPLANT
SUT VIC AB 0 CT1 36 (SUTURE) ×6 IMPLANT
SUT VIC AB 2-0 CT1 27 (SUTURE) ×6
SUT VIC AB 2-0 CT1 TAPERPNT 27 (SUTURE) ×3 IMPLANT
SYR 10ML LL (SYRINGE) ×2 IMPLANT
SYR 20ML LL LF (SYRINGE) ×2 IMPLANT
SYR 30ML LL (SYRINGE) IMPLANT
TIP FAN IRRIG PULSAVAC PLUS (DISPOSABLE) ×1 IMPLANT
TRAP FLUID SMOKE EVACUATOR (MISCELLANEOUS) ×2 IMPLANT
WRAPON POLAR PAD KNEE (MISCELLANEOUS) ×2

## 2021-07-08 NOTE — Anesthesia Procedure Notes (Addendum)
Spinal  Patient location during procedure: OR Start time: 07/08/2021 7:36 AM End time: 07/08/2021 7:40 AM Reason for block: surgical anesthesia Staffing Anesthesiologist: Martha Clan, MD Resident/CRNA: Daryel Gerald, RN Preanesthetic Checklist Completed: patient identified, IV checked, site marked, risks and benefits discussed, surgical consent, monitors and equipment checked, pre-op evaluation and timeout performed Spinal Block Patient position: sitting Prep: DuraPrep Patient monitoring: heart rate, cardiac monitor, continuous pulse ox and blood pressure Approach: midline Location: L4-5 Injection technique: single-shot Needle Needle type: Sprotte  Needle gauge: 24 G Needle length: 9 cm Assessment Sensory level: T4 Events: CSF return

## 2021-07-08 NOTE — Evaluation (Signed)
Physical Therapy Evaluation Patient Details Name: Tina Frey MRN: [id number] DOB: 1941/08/10 Today's Date: 07/08/2021   History of Present Illness  Pt is a 80 y.o. F s/p R TKA. PMH includes COPD, GERD, Asthma, heart murmur, HTN, mitral valve prolapse, osteoporosis, and L TKA (2016).  Clinical Impression  Pt alert, seated in recliner with family present. Pt is alert, Aox4, pleasant and motivated for PT. PLOF is independent with all ADLs and mobility.   Pt demonstrates good control during transfers w/ RW, min-guard for safety. Ambulated > 150 feet with min-guard, RW. Foot drop noted during gait with pt's inability to dorsiflex ankle. Pt stated the event occurred after surgery. Ankle DF PROM is full, pt unable to actively DF ankle. Sensation is intact to dorsal and plantar aspects of foot, with decreased sensation to lateral and medial tibia. Pt demonstrates full AROM knee extension, 90 degree knee flexion, and SLR with slight quad lag. Continued with stair training with min-guard and 2 hand rails for 4 steps. Pt also taught how to navigate their one home step backward due to foot drop with min-guard and RW. Skilled PT intervention is indicated to address deficits in function, mobility, and to return to PLOF as able.  Discharge recommendations are HHPT due to having family support.    Follow Up Recommendations Home health PT    Equipment Recommendations  Rolling walker with 5" wheels;3in1 (PT)    Recommendations for Other Services       Precautions / Restrictions Precautions Precautions: Knee;Fall Precaution Booklet Issued: Yes (comment) Restrictions Weight Bearing Restrictions: Yes RLE Weight Bearing: Weight bearing as tolerated      Mobility  Bed Mobility               General bed mobility comments: Pt seated in recliner    Transfers Overall transfer level: Needs assistance Equipment used: Rolling walker (2 wheeled) Transfers: Sit to/from Stand Sit to Stand: Min  guard         General transfer comment: excellent control eccentric and concentrically  Ambulation/Gait Ambulation/Gait assistance: Min guard Gait Distance (Feet): 150 Feet Assistive device: Rolling walker (2 wheeled) Gait Pattern/deviations: Decreased dorsiflexion - right;Step-to pattern Gait velocity: Decreased   General Gait Details: Foot drop on RLE w/ toe touch at heel strike, pt comensates with increased knee and hip flexion  Stairs Stairs: Yes Stairs assistance: Min guard Stair Management: Two rails;Step to pattern;Forwards Number of Stairs: 5 General stair comments: ascended steps w/ 2 hand rails and then 1 step ascending backwards  Wheelchair Mobility    Modified Rankin (Stroke Patients Only)       Balance Overall balance assessment: Needs assistance Sitting-balance support: No upper extremity supported;Feet supported Sitting balance-Leahy Scale: Good     Standing balance support: Bilateral upper extremity supported;Single extremity supported;During functional activity Standing balance-Leahy Scale: Fair Standing balance comment: requires BUE but able to stand statically with single UE w/o LOB                             Pertinent Vitals/Pain Pain Assessment: 0-10 Pain Score: 8  Pain Location: R posterior knee Pain Descriptors / Indicators: Aching;Sore;Tightness Pain Intervention(s): Limited activity within patient's tolerance;Monitored during session;Repositioned;Ice applied    Home Living Family/patient expects to be discharged to:: Private residence Living Arrangements: Alone Available Help at Discharge: Family Type of Home: House Home Access: Stairs to enter Entrance Stairs-Rails: None Entrance Stairs-Number of Steps: Half step Home Layout: One level  Home Equipment: None      Prior Function Level of Independence: Independent         Comments: Walks without an AD, drives, indep. w/ all ADLs; no falls within the last 6 months      Hand Dominance        Extremity/Trunk Assessment   Upper Extremity Assessment Upper Extremity Assessment: Overall WFL for tasks assessed    Lower Extremity Assessment Lower Extremity Assessment: RLE deficits/detail;LLE deficits/detail RLE Deficits / Details: unable to perform DF AROM w/ 0/5 MMT; 0 degrees knee extension, 90 degrees Knee flexion RLE Sensation: decreased light touch (decreased light touch medial, lateral tib/fib; full sensation plantar & dorsal aspects of foot) LLE Deficits / Details: able to move against gravity LLE Sensation: WNL       Communication   Communication: No difficulties  Cognition Arousal/Alertness: Awake/alert Behavior During Therapy: WFL for tasks assessed/performed Overall Cognitive Status: Within Functional Limits for tasks assessed                                 General Comments: AOx4, very pleasant, motivated      General Comments      Exercises Total Joint Exercises Ankle Circles/Pumps: AROM;5 reps;Both;Seated Short Arc Quad: AROM;5 reps;Seated;Right Hip ABduction/ADduction: AROM;Both;Right;Seated Straight Leg Raises: AROM;Right;Seated;5 reps Long Arc Quad: AROM;Right;Seated;5 reps Knee Flexion: AROM;Right;5 reps;Seated Goniometric ROM: Knee flexion (seated): 90 degrees, knee extension (seated): 0 degrees; minimal quad lag with SLR Other Exercises Other Exercises: Pt education: HEP, precautions with foot drop during mobility   Assessment/Plan    PT Assessment Patient needs continued PT services  PT Problem List Decreased strength;Decreased range of motion;Decreased activity tolerance;Decreased balance;Decreased mobility       PT Treatment Interventions      PT Goals (Current goals can be found in the Care Plan section)  Acute Rehab PT Goals Patient Stated Goal: to go home PT Goal Formulation: With patient Time For Goal Achievement: 07/22/21 Potential to Achieve Goals: Good    Frequency BID    Barriers to discharge        Co-evaluation               AM-PAC PT "6 Clicks" Mobility  Outcome Measure Help needed turning from your back to your side while in a flat bed without using bedrails?: A Little Help needed moving from lying on your back to sitting on the side of a flat bed without using bedrails?: A Little Help needed moving to and from a bed to a chair (including a wheelchair)?: A Little Help needed standing up from a chair using your arms (e.g., wheelchair or bedside chair)?: A Little Help needed to walk in hospital room?: A Little Help needed climbing 3-5 steps with a railing? : A Little 6 Click Score: 18    End of Session Equipment Utilized During Treatment: Gait belt Activity Tolerance: Patient tolerated treatment well Patient left: in chair;with family/visitor present Nurse Communication: Mobility status PT Visit Diagnosis: Other abnormalities of gait and mobility (R26.89);Muscle weakness (generalized) (M62.81)    Time: 1417-1500 PT Time Calculation (min) (ACUTE ONLY): 43 min   Charges:              [employer], SPT

## 2021-07-08 NOTE — Discharge Instructions (Addendum)
Orthopedic discharge instructions: May sponge bathe or shower with intact OpSite dressing. Apply ice frequently to knee or use Polar Care. Start Eliquis 2.5 mg twice daily for 2 weeks on Friday, 07/09/2021, then take aspirin 325 mg daily for 4 weeks. Take hydrocodone as prescribed or extra strength Tylenol when needed.  May weight-bear as tolerated on right leg - use walker for balance and support. Follow-up in 10-14 days or as scheduled.   AMBULATORY SURGERY  DISCHARGE INSTRUCTIONS   The drugs that you were given will stay in your system until tomorrow so for the next 24 hours you should not:  Drive an automobile Make any legal decisions Drink any alcoholic beverage   You may resume regular meals tomorrow.  Today it is better to start with liquids and gradually work up to solid foods.  You may eat anything you prefer, but it is better to start with liquids, then soup and crackers, and gradually work up to solid foods.   Please notify your doctor immediately if you have any unusual bleeding, trouble breathing, redness and pain at the surgery site, drainage, fever, or pain not relieved by medication.    Additional Instructions:        Please contact your physician with any problems or Same Day Surgery at [phone], Monday through Friday 6 am to 4 pm, or [place] at [facility] number at [phone].

## 2021-07-08 NOTE — H&P (Signed)
History of Present Illness: Tina Frey is a 80 y.o. female who presents today for her surgical history and physical for upcoming right total knee arthroplasty. The patient is scheduled to undergo a right total knee arthroplasty with Dr. Roland Rack on 07/08/2021. The patient reports a 6 out of 10 pain score in the right knee at today's visit. She denies any falls or trauma affecting the right knee since she was last evaluated. The patient denies any numbness or tingling to the right lower extremity. She is not a diabetic. She denies any personal history of heart attack, stroke, asthma or COPD. No personal history of blood clots.  Past Medical History:  Arthritis   Asthma   Cataract cortical, senile 2006   Colitis   COPD (chronic obstructive pulmonary disease) (CMS-HCC)   Depression   GERD (gastroesophageal reflux disease)   Heart murmur, unspecified ????????????   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 Roland Rack)   BREAST EXCISIONAL BIOPSY Right 1982   CATARACT EXTRACTION 02/2005 (L); 03/2005 (R)   CHOLECYSTECTOMY 1989   [place] joint repair Left 2003   COLONOSCOPY 07/19/2016 (Tubular adenoma of colon/Hyperplastic colon polyp/Repeat 26yrs/MUS)   ENDOSCOPIC CARPAL TUNNEL RELEASE Left 2003   ENDOSCOPIC CARPAL TUNNEL RELEASE Right 2010   [place] repair 2007   [place] (R); 08/2004  (L)   INTRO CATH TO MAIN PULMONARY ARTERY/RIGHT HEART 2007  negative   LEFT KNEE ARTHROSCOPY 2008   Left total knee replacement 2008   TOTAL SHOULDER REPLACEMENT 2013   Past Family History:  Ovarian cancer Mother   Cancer Mother   Coronary Artery Disease Father (ENLARGED HEART)   Medications:  aspirin 81 MG EC tablet Take 81 mg by mouth once daily.   atorvastatin (LIPITOR) 20 MG tablet TAKE 1 TABLET(20 MG) BY MOUTH EVERY DAY 90 tablet 1   biotin 1 mg tablet Take 5,000 mcg by mouth once daily.   cholecalciferol (VITAMIN D3) 2,000 unit tablet Take 2,000 Units by mouth once daily   ibuprofen (MOTRIN) 200 MG tablet Take by mouth Take 400 mg by mouth every 8 (eight) hours as needed.   lisinopriL (ZESTRIL) 10 MG tablet TAKE 1 TABLET(10 MG) BY MOUTH EVERY DAY 90 tablet 1   metoprolol tartrate (LOPRESSOR) 50 MG tablet TAKE 1/2 TABLET(25 MG) BY MOUTH TWICE DAILY 90 tablet 1   sertraline (ZOLOFT) 50 MG tablet TAKE 1 TABLET(50 MG) BY MOUTH EVERY DAY 90 tablet 1   traZODone (DESYREL) 50 MG tablet TAKE 1 TABLET(50 MG) BY MOUTH EVERY NIGHT 30 tablet 5   VIT C/E/ZN/COPPR/LUTEIN/ZEAXAN (PRESERVISION AREDS 2 ORAL) Take 2 tablets by mouth once daily.   Allergies:  Cephalexin Diarrhea   Ciprofloxacin Diarrhea   Morphine Vomiting and Nausea And Vomiting   Sulfa (Sulfonamide Antibiotics) Other (Causes yeast infections)  Review of Systems:  A comprehensive 14 point ROS was performed, reviewed by me today, and the pertinent orthopaedic findings are documented in the HPI.  Physical  Exam: BP 120/80  Ht 157.5 cm (5\' 2" )  Wt 57.3 kg (126 lb 6.4 oz)  LMP (LMP Unknown)  BMI 23.12 kg/m  General/Constitutional: The patient appears to be well-nourished, well-developed, and in no acute distress. Neuro/Psych: Normal mood and affect, oriented to person, place and time. Eyes: Non-icteric. Pupils are equal, round, and reactive to light, and exhibit synchronous movement. ENT: Unremarkable. Lymphatic: No  palpable adenopathy. Respiratory: Lungs clear to auscultation, Normal chest excursion, No wheezes and Non-labored breathing Cardiovascular: Regular rate and rhythm. No murmurs. and No edema, swelling or tenderness, except as noted in detailed exam. Integumentary: No impressive skin lesions present, except as noted in detailed exam. Musculoskeletal: Unremarkable, except as noted in detailed exam.  Right knee exam:  The patient  with at most a minimal limp, but is not using any assistive devices. Her knee is in mild valgus alignment.  On inspection, her arthroscopic portal sites and surgical incision remain well-healed and without evidence for infection.  She exhibits a small effusion, but otherwise, no swelling, erythema, ecchymosis, abrasions, or other skin abnormalities are noted. There is mild tenderness along the medial and lateral joint lines.  She is able to extend her knee fully and flex beyond 130 degrees without any discomfort.  Her patella tracks well and is without crepitance.  The knee is stable to varus and valgus stressing, but does exhibit some mild pseudolaxity to valgus stressing.  She again is neurovascularly intact to the right lower extremity and foot.  Imaging: No new images today.  Impression: 1. Primary osteoarthritis of right knee. 2. 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 3. Loose body of right knee.  Plan:  1. Treatment options were discussed today with the patient. 2. The patient is scheduled for a right total knee arthroplasty with Dr. Roland Rack on 07/08/2021. 3. The patient was instructed on the risk and benefits of surgery at today's visit and wishes to proceed. 4. This document will serve as a surgical history and physical for the patient. 5. The patient will follow-up per standard post-op protocol. They can call the clinic they have any questions, new symptoms develop or symptoms worsen.  The procedure was discussed with the patient, as were the  potential risks (including bleeding, infection, nerve and/or blood vessel injury, persistent or recurrent pain, failure of the hardware, knee instability, need for further surgery, blood clots, strokes, heart attacks and/or arhythmias, pneumonia, etc.) and benefits. The patient states her understanding and wishes to proceed.   H&P reviewed and patient re-examined. No changes.

## 2021-07-08 NOTE — Transfer of Care (Signed)
Immediate Anesthesia Transfer of Care Note  Patient: Tina Frey  Procedure(s) Performed: TOTAL KNEE ARTHROPLASTY (Right: Knee)  Patient Location: PACU  Anesthesia Type:Spinal  Level of Consciousness: awake and alert   Airway & Oxygen Therapy: Patient Spontanous Breathing  Post-op Assessment: Report given to RN and Post -op Vital signs reviewed and stable  Post vital signs: Reviewed and stable  Last Vitals:  Vitals Value Taken Time  BP 104/57 07/08/21 1002  Temp 36.2 C 07/08/21 1000  Pulse 78 07/08/21 1005  Resp 18 07/08/21 1005  SpO2 100 % 07/08/21 1005  Vitals shown include unvalidated device data.  Last Pain:  Vitals:   07/08/21 0617  TempSrc: Oral         Complications: No notable events documented.

## 2021-07-08 NOTE — Progress Notes (Signed)
Patient and son understand instructions, PT oked patient to be discharged

## 2021-07-08 NOTE — Anesthesia Procedure Notes (Signed)
Procedure Name: MAC Date/Time: 07/08/2021 7:43 AM Performed by: Martha Clan, MD Pre-anesthesia Checklist: Patient identified, Emergency Drugs available, Suction available and Patient being monitored Patient Re-evaluated:Patient Re-evaluated prior to induction Oxygen Delivery Method: Simple face mask

## 2021-07-08 NOTE — Op Note (Signed)
07/08/2021  10:01 AM  Patient:   Tina Frey  Pre-Op Diagnosis:   Degenerative joint disease, right knee.  Post-Op Diagnosis:   Same  Procedure:   Right TKA using all-cemented [employer] Vanguard system with a 60 mm PCR femur, a 67 mm tibial tray with a 10 mm anterior stabilized E-poly insert, and a 31 x 6.2 mm all-poly 3-pegged domed patella.  Surgeon:   Pascal Lux, MD  Assistant:   Cameron Proud, PA-C; Ardelle Park, PA-S  Anesthesia:   Spinal  Findings:   As above  Complications:   None  EBL:   5 cc  Fluids:   800 cc crystalloid  UOP:   None cc  TT:   90 minutes at 300 mmHg  Drains:   None  Closure:   Staples  Implants:   As above  Brief Clinical Note:   The patient is a 80 year old female with a long history of progressively worsening right knee pain. The patient's symptoms have progressed despite medications, activity modification, injections, etc. The patient's history and examination were consistent with advanced degenerative joint disease of the right knee confirmed by plain radiographs. The patient presents at this time for a right total knee arthroplasty.  Procedure:   The patient was brought into the operating room. After adequate spinal anesthesia was obtained, the patient was lain in the supine position before the right lower extremity was prepped with ChloraPrep solution and draped sterilely. Preoperative antibiotics were administered. After verifying the proper laterality with a surgical timeout, the limb was exsanguinated with an Esmarch and the tourniquet inflated to 300 mmHg. A standard anterior approach to the knee was made through an approximately 7 inch incision. The incision was carried down through the subcutaneous tissues to expose superficial retinaculum. This was split the length of the incision and the medial flap elevated sufficiently to expose the medial retinaculum. The medial retinaculum was incised, leaving a 3-4 mm cuff of tissue on the  patella. This was extended distally along the medial border of the patellar tendon and proximally through the medial third of the quadriceps tendon. A subtotal fat pad excision was performed before the soft tissues were elevated off the anteromedial and anterolateral aspects of the proximal tibia to the level of the collateral ligaments. The anterior portions of the medial and lateral menisci were removed, as was the anterior cruciate ligament. With the knee flexed to 90, the external tibial guide was positioned and the appropriate proximal tibial cut made. This piece was taken to the back table where it was measured and found to be optimally replicated by a 67 mm component.  Attention was directed to the distal femur. The intramedullary canal was accessed through a 3/8" drill hole. The intramedullary guide was inserted and positioned in order to obtain a neutral flexion gap. The intercondylar block was positioned with care taken to avoid notching the anterior cortex of the femur. The appropriate cut was made. Next, the distal cutting block was placed at 6 of valgus alignment. Using the 9 mm slot, the distal cut was made. The distal femur was measured and found to be optimally replicated by the 60 mm component. The 60 mm 4-in-1 cutting block was positioned and first the posterior, then the posterior chamfer, the anterior chamfer, and finally the anterior cuts were made. At this point, the posterior portions medial and lateral menisci were removed. A trial reduction was performed using the appropriate femoral and tibial components with the 10 mm insert. This demonstrated  excellent stability to varus and valgus stressing both in flexion and extension while permitting full extension. Patella tracking was assessed and found to be excellent. Therefore, the tibial guide position was marked on the proximal tibia. The patella thickness was measured and found to be 19 mm. Therefore, the appropriate cut was made. The  patellar surface was measured and found to be optimally replicated by the 31 mm component. The three peg holes were drilled in place before the trial button was inserted. Patella tracking was assessed and found to be excellent, passing the "no thumb test". The lug holes were drilled into the distal femur before the trial component was removed, leaving only the tibial tray. The keel was then created using the appropriate tower, reamer, and punch.  The bony surfaces were prepared for cementing by irrigating them thoroughly with sterile saline solution via the jet lavage system. A bone plug was fashioned from some of the bone that had been removed previously and used to plug the distal femoral canal. In addition, 20 cc of Exparel diluted out to 60 cc with normal saline and 30 cc of 0.5% Sensorcaine were injected into the postero-medial and postero-lateral aspects of the knee, the medial and lateral gutter regions, and the peri-incisional tissues to help with postoperative analgesia. Meanwhile, the cement was being mixed on the back table. When it was ready, the tibial tray was cemented in first. The excess cement was removed using [employer] fast streamer. Next, the femoral component was impacted into place. Again, the excess cement was removed using [employer] fast streamer. The 10 mm trial insert was positioned and the knee brought into extension while the cement hardened. Finally, the patella was cemented into place and secured using the patellar clamp. Again, the excess cement was removed using [employer] fast streamer. Once the cement had hardened, the knee was placed through a range of motion with the findings as described above. Therefore, the trial insert was removed and, after verifying that no cement had been retained posteriorly, the permanent 10 mm anterior stabilized E-polyethylene insert was positioned and secured using the appropriate key locking mechanism. Again the knee was placed through a range of motion with the findings as  described above.  The wound was copiously irrigated with sterile saline solution using the jet lavage system before the quadriceps tendon and retinacular layer were reapproximated using #0 Vicryl interrupted sutures. The superficial retinacular layer also was closed using a running #0 Vicryl suture. A total of 10 cc of transexemic acid (TXA) was injected intra-articularly before the subcutaneous tissues were closed in several layers using 2-0 Vicryl interrupted sutures. The skin was closed using staples. A sterile honeycomb dressing was applied to the skin before the leg was wrapped with an Ace wrap to accommodate the Polar Care device. The patient was then awakened and returned to the recovery room in satisfactory condition after tolerating the procedure well.

## 2021-07-08 NOTE — Anesthesia Preprocedure Evaluation (Signed)
Anesthesia Evaluation  Patient identified by MRN, date of birth, ID band Patient awake    Reviewed: Allergy & Precautions, H&P , NPO status , Patient's Chart, lab work & pertinent test results  History of Anesthesia Complications Negative for: history of anesthetic complications  Airway Mallampati: III  TM Distance: >3 FB Neck ROM: full    Dental  (+) Chipped, Partial Lower, Upper Dentures, Dental Advidsory Given   Pulmonary neg shortness of breath, COPD, neg recent URI, former smoker,    Pulmonary exam normal        Cardiovascular Exercise Tolerance: Good hypertension, (-) angina+ CAD  (-) Past MI and (-) DOE Normal cardiovascular exam(-) dysrhythmias + Valvular Problems/Murmurs MVP      Neuro/Psych neg Seizures PSYCHIATRIC DISORDERS Depression  Neuromuscular disease    GI/Hepatic negative GI ROS, Neg liver ROS, neg GERD  ,  Endo/Other  negative endocrine ROS  Renal/GU      Musculoskeletal  (+) Arthritis ,   Abdominal   Peds  Hematology negative hematology ROS (+)   Anesthesia Other Findings Past Medical History: No date: Allergy No date: Arthritis No date: Arthritis No date: Cancer ([place])     Comment:  basal cell on forehead No date: Carotid artery stenosis No date: Cataract No date: COPD (chronic obstructive pulmonary disease) ([facility])     Comment:  patient denies this diagnosis. No date: Heart murmur No date: Hyperlipidemia No date: Hypertension No date: Mitral valve prolapse No date: Scoliosis No date: Wears dentures  Past Surgical History: No date: ABDOMINAL HYSTERECTOMY No date: APPENDECTOMY No date: ARTHROSCOPY KNEE W/ DRILLING     Comment:  patient denies this prior to this admission No date: biopsy lip 1982: BREAST EXCISIONAL BIOPSY; Right     Comment:  NEG No date: BREAST SURGERY; Right     Comment:  biopsy /benigning No date: BUNIONECTOMY     Comment:  right No date: CARDIAC  CATHETERIZATION     Comment:  patient does not remember having this done No date: CARDIOVASCULAR STRESS TEST No date: CARPAL TUNNEL RELEASE     Comment:  left No date: CARPAL TUNNEL RELEASE No date: CATARACT EXTRACTION     Comment:  left No date: CHOLECYSTECTOMY No date: cmc joint repair     Comment:  left 07/19/2016: COLONOSCOPY WITH PROPOFOL; N/A     Comment:  Procedure: COLONOSCOPY WITH PROPOFOL;  Surgeon: Lollie Sails, MD;  Location: [facility] ENDOSCOPY;  Service:               Endoscopy;  Laterality: N/A; 10/11/2017: CYSTOSCOPY; N/A     Comment:  Procedure: CYSTOSCOPY;  Surgeon: Bjorn Loser, MD;               Location: [facility] ORS;  Service: Urology;  Laterality: N/A; No date: EYE SURGERY     Comment:  bilateral cataract No date: HERNIA REPAIR     Comment:  bilateral inguinal 2009: JOINT REPLACEMENT; Left     Comment:  knee replacement No date: MOHS SURGERY 11/23/2016: PLANTAR FASCIA RELEASE; Left     Comment:  Procedure: ENDOSCOPIC PLANTAR FASCIA RELEASE;  Surgeon:               [name], DPM;  Location: [place];                Service: Podiatry;  Laterality: Left; 10/11/2017: PUBOVAGINAL SLING; N/A     Comment:  Procedure:  PUBO-VAGINAL SLING;  Surgeon: Bjorn Loser, MD;  Location: [facility] ORS;  Service: Urology;                Laterality: N/A; No date: REPLACEMENT TOTAL KNEE     Comment:  left 2015: right shoulder surgery; Right     Comment:  shoulder replacement 11/23/2016: TARSAL TUNNEL RELEASE; Left     Comment:  Procedure: TARSAL TUNNEL RELEASE;  Surgeon: [name], DPM;  Location: [place];  Service:               Podiatry;  Laterality: Left;  POPLITEAL  BMI    Body Mass Index: 23.06 kg/m      Reproductive/Obstetrics negative OB ROS                             Anesthesia Physical  Anesthesia Plan  ASA: 3  Anesthesia Plan: Spinal   Post-op Pain  Management:    Induction: Intravenous  PONV Risk Score and Plan: Treatment may vary due to age or medical condition, TIVA and Propofol infusion  Airway Management Planned: Natural Airway and Simple Face Mask  Additional Equipment:   Intra-op Plan:   Post-operative Plan:   Informed Consent: I have reviewed the patients History and Physical, chart, labs and discussed the procedure including the risks, benefits and alternatives for the proposed anesthesia with the patient or authorized representative who has indicated his/her understanding and acceptance.     Dental Advisory Given  Plan Discussed with: Anesthesiologist, CRNA and Surgeon  Anesthesia Plan Comments: (Patient consented for risks of anesthesia including but not limited to:  - adverse reactions to medications - damage to eyes, teeth, lips or other oral mucosa - nerve damage due to positioning  - sore throat or hoarseness - Damage to heart, brain, nerves, lungs, other parts of body or loss of life  Patient voiced understanding.)        Anesthesia Quick Evaluation

## 2021-07-09 ENCOUNTER — Encounter: Payer: Self-pay | Admitting: Surgery

## 2021-07-12 NOTE — Addendum Note (Signed)
Addendum  created 07/12/21 1608 by Martha Clan, MD   Clinical Note Signed

## 2021-07-12 NOTE — Anesthesia Postprocedure Evaluation (Addendum)
Anesthesia Post Note  Patient: Tina Frey  Procedure(s) Performed: TOTAL KNEE ARTHROPLASTY (Right: Knee)  Patient location during evaluation: PACU Anesthesia Type: Spinal Level of consciousness: awake and alert Pain management: pain level controlled Vital Signs Assessment: post-procedure vital signs reviewed and stable Respiratory status: spontaneous breathing and respiratory function stable Cardiovascular status: blood pressure returned to baseline and stable Postop Assessment: spinal receding Anesthetic complications: no   No notable events documented.   Last Vitals:  Vitals:   07/08/21 1048 07/08/21 1528  BP: 116/63 131/80  Pulse: 79 87  Resp: 18 20  Temp: (!) 36.3 C (!) 36.1 C  SpO2: 94% 99%    Last Pain:  Vitals:   07/08/21 1048  TempSrc: Temporal  PainSc: 0-No pain                 Martha Clan

## 2021-08-05 ENCOUNTER — Other Ambulatory Visit: Payer: Self-pay | Admitting: Internal Medicine

## 2021-08-05 DIAGNOSIS — Z1231 Encounter for screening mammogram for malignant neoplasm of breast: Secondary | ICD-10-CM

## 2021-09-02 ENCOUNTER — Other Ambulatory Visit: Payer: Self-pay

## 2021-09-02 ENCOUNTER — Ambulatory Visit
Admission: RE | Admit: 2021-09-02 | Discharge: 2021-09-02 | Disposition: A | Discharge status: Home / Self Care | Payer: Medicare Other | Source: Ambulatory Visit | Attending: Internal Medicine | Admitting: Internal Medicine

## 2021-09-02 DIAGNOSIS — Z1231 Encounter for screening mammogram for malignant neoplasm of breast: Secondary | ICD-10-CM | POA: Diagnosis present

## 2021-12-11 IMAGING — MG MM DIGITAL SCREENING BILAT W/ TOMO AND CAD
8 series · 8 of 24 positions shown · non-contrast
Comparison: Previous exam(s).

CLINICAL DATA: Screening.

EXAM:
DIGITAL SCREENING BILATERAL MAMMOGRAM WITH TOMOSYNTHESIS AND CAD
TECHNIQUE: Bilateral screening digital craniocaudal and mediolateral oblique
mammograms were obtained. Bilateral screening digital breast
tomosynthesis was performed. The images were evaluated with
computer-aided detection.

[R MLO synth-2D]
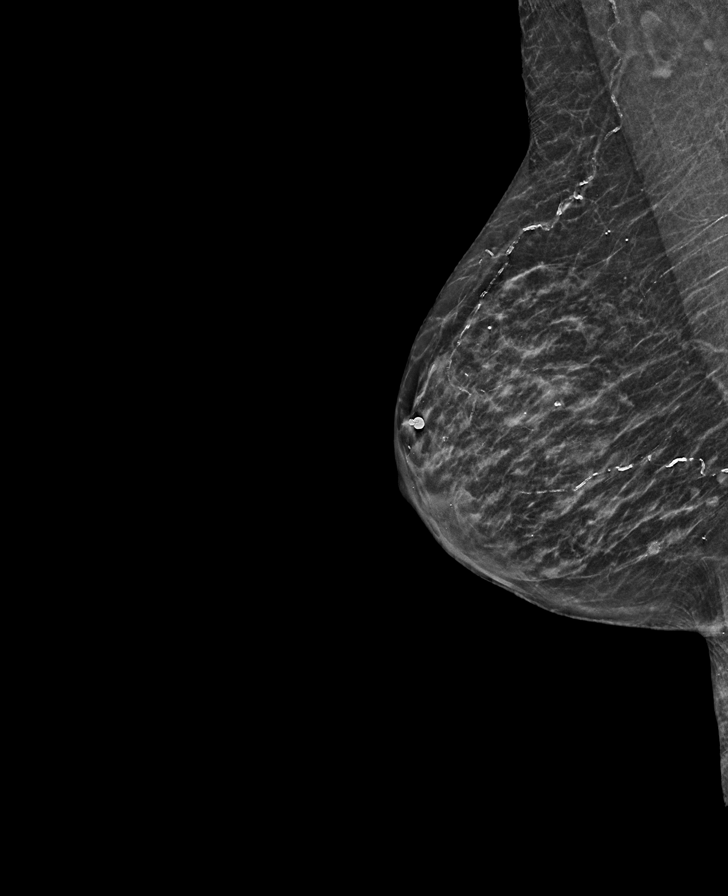

[L CC synth-2D]
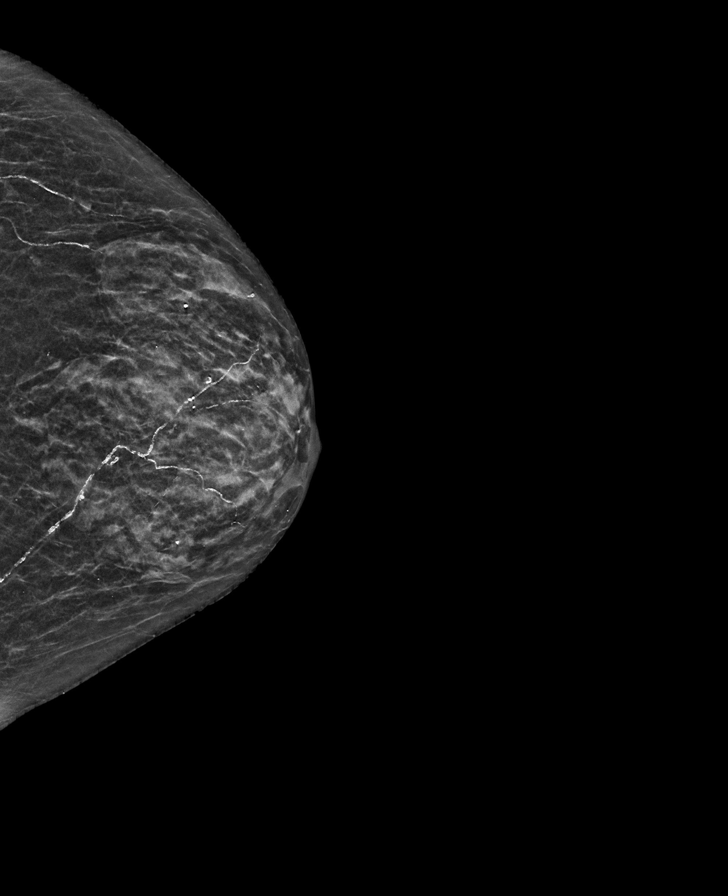

[R CC synth-2D]
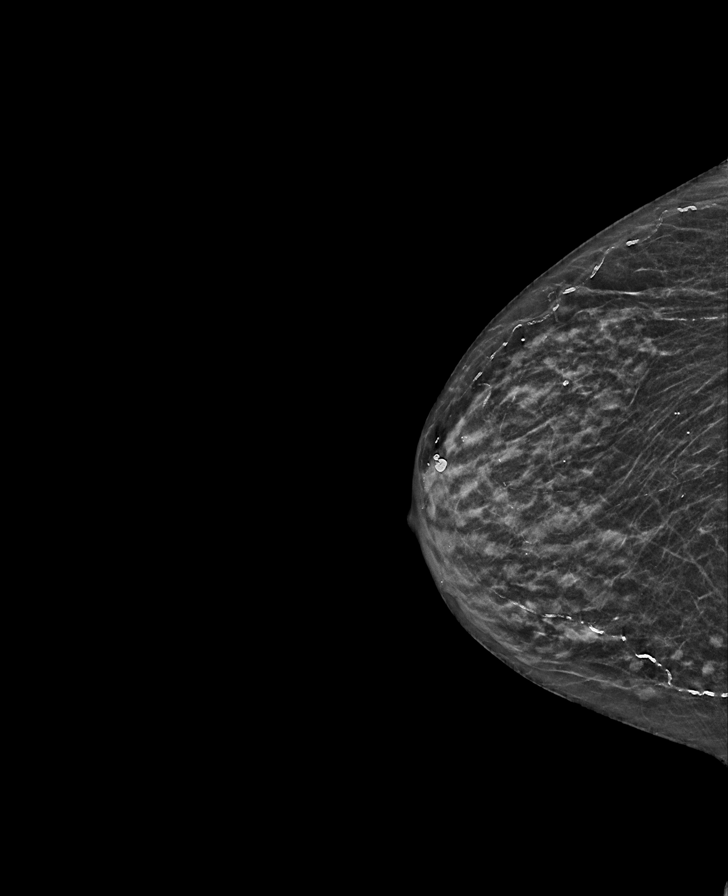

[L MLO synth-2D]
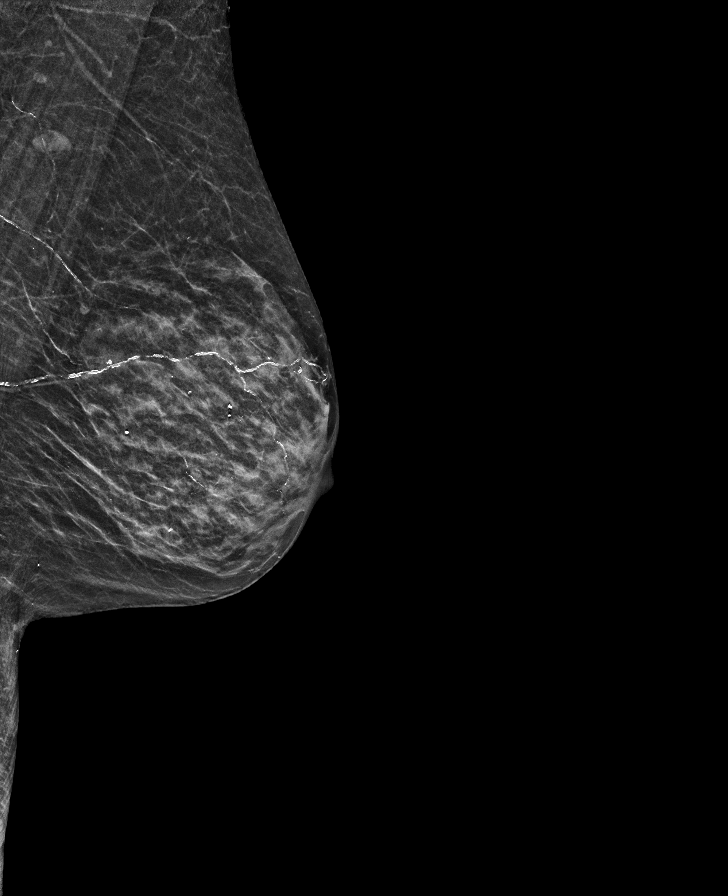

[R MLO tomo · tomo slice 22/43.0]
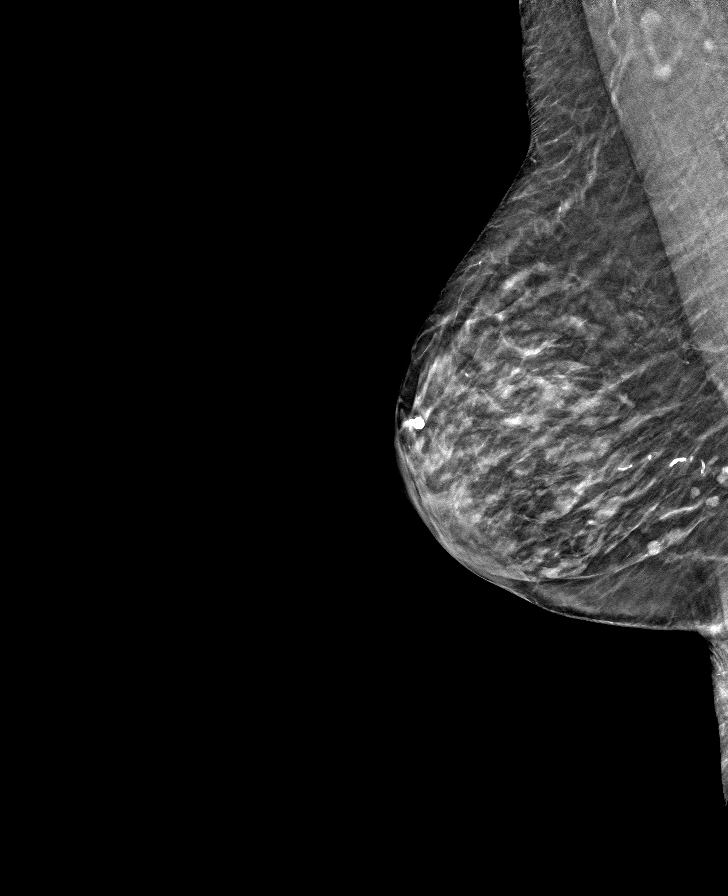

[R CC tomo · tomo slice 21/41.0]
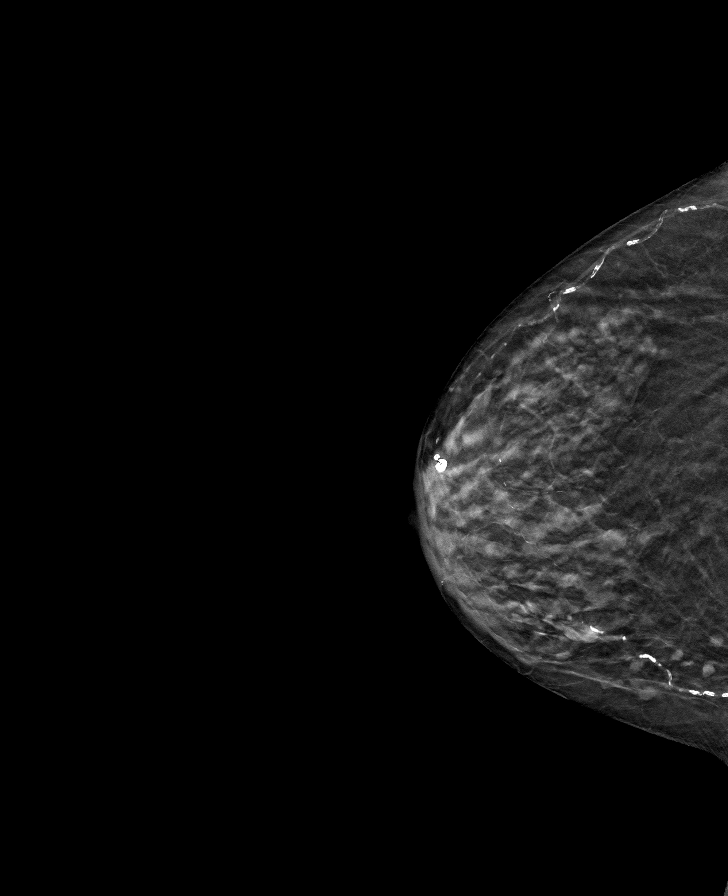

[L CC tomo · tomo slice 22/43.0]
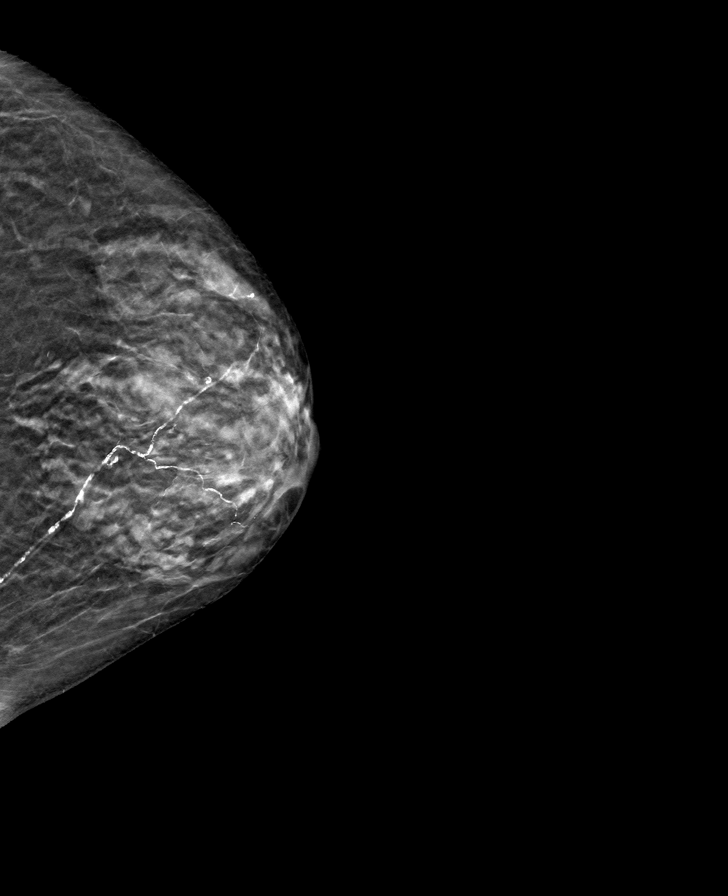

[L MLO tomo · tomo slice 22/43.0]
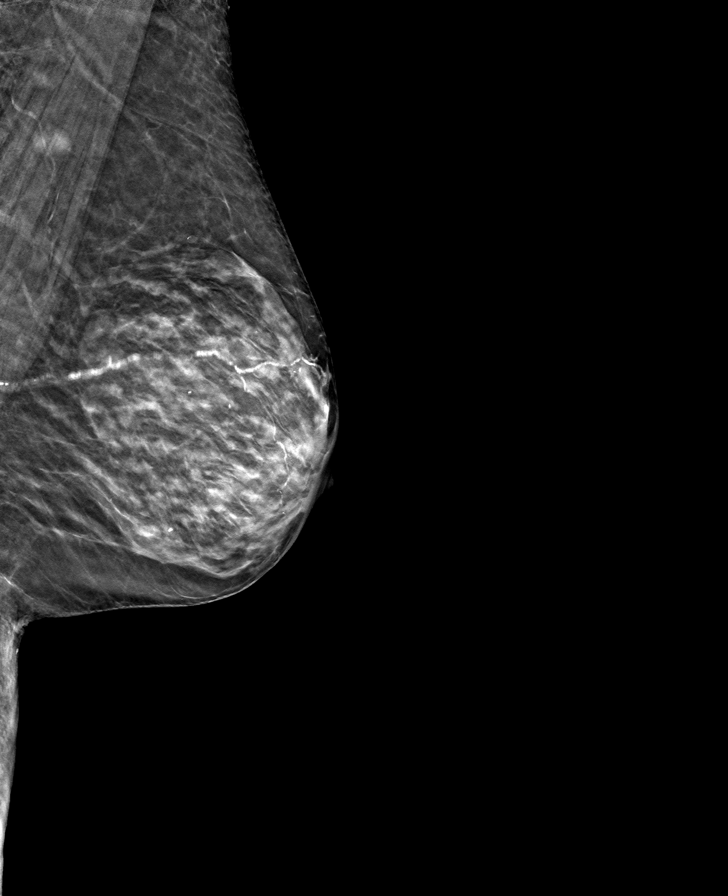

[8 of 24 positions shown; findings below may reference images not displayed]

ACR Breast Density Category c: The breast tissue is heterogeneously
dense, which may obscure small masses.
FINDINGS: There are no findings suspicious for malignancy.
IMPRESSION: No mammographic evidence of malignancy. A result letter of this
screening mammogram will be mailed directly to the patient.

RECOMMENDATION:
Screening mammogram in one year. (Code:Q3-W-BC3)

BI-RADS CATEGORY  1: Negative.

## 2022-08-09 ENCOUNTER — Encounter
Admission: RE | Disposition: A | Discharge status: Home / Self Care | Payer: Self-pay | Source: Ambulatory Visit | Attending: Gastroenterology

## 2022-08-09 ENCOUNTER — Ambulatory Visit
Admission: RE | Admit: 2022-08-09 | Discharge: 2022-08-09 | Disposition: A | Discharge status: Home / Self Care | Payer: Medicare Other | Source: Ambulatory Visit | Attending: Gastroenterology | Admitting: Gastroenterology

## 2022-08-09 ENCOUNTER — Ambulatory Visit: Payer: Medicare Other | Admitting: Anesthesiology

## 2022-08-09 DIAGNOSIS — Z87891 Personal history of nicotine dependence: Secondary | ICD-10-CM | POA: Insufficient documentation

## 2022-08-09 DIAGNOSIS — Z85828 Personal history of other malignant neoplasm of skin: Secondary | ICD-10-CM | POA: Diagnosis not present

## 2022-08-09 DIAGNOSIS — Z86010 Personal history of colonic polyps: Secondary | ICD-10-CM | POA: Diagnosis not present

## 2022-08-09 DIAGNOSIS — K640 First degree hemorrhoids: Secondary | ICD-10-CM | POA: Diagnosis not present

## 2022-08-09 DIAGNOSIS — Q438 Other specified congenital malformations of intestine: Secondary | ICD-10-CM | POA: Insufficient documentation

## 2022-08-09 DIAGNOSIS — Z79899 Other long term (current) drug therapy: Secondary | ICD-10-CM | POA: Insufficient documentation

## 2022-08-09 DIAGNOSIS — I341 Nonrheumatic mitral (valve) prolapse: Secondary | ICD-10-CM | POA: Insufficient documentation

## 2022-08-09 DIAGNOSIS — F32A Depression, unspecified: Secondary | ICD-10-CM | POA: Diagnosis not present

## 2022-08-09 DIAGNOSIS — J449 Chronic obstructive pulmonary disease, unspecified: Secondary | ICD-10-CM | POA: Diagnosis not present

## 2022-08-09 DIAGNOSIS — K5730 Diverticulosis of large intestine without perforation or abscess without bleeding: Secondary | ICD-10-CM | POA: Insufficient documentation

## 2022-08-09 DIAGNOSIS — I739 Peripheral vascular disease, unspecified: Secondary | ICD-10-CM | POA: Diagnosis not present

## 2022-08-09 DIAGNOSIS — Z1211 Encounter for screening for malignant neoplasm of colon: Secondary | ICD-10-CM | POA: Diagnosis not present

## 2022-08-09 DIAGNOSIS — I10 Essential (primary) hypertension: Secondary | ICD-10-CM | POA: Insufficient documentation

## 2022-08-09 HISTORY — PX: COLONOSCOPY WITH PROPOFOL: SHX5780

## 2022-08-09 SURGERY — COLONOSCOPY WITH PROPOFOL
Anesthesia: General

## 2022-08-09 MED ORDER — MIDAZOLAM HCL 2 MG/2ML IJ SOLN
INTRAMUSCULAR | Status: DC | PRN
  Administered 2022-08-09: 2 mg via INTRAVENOUS

## 2022-08-09 MED ORDER — PROPOFOL 500 MG/50ML IV EMUL
INTRAVENOUS | Status: DC | PRN
  Administered 2022-08-09: 75 ug/kg/min via INTRAVENOUS

## 2022-08-09 MED ORDER — LIDOCAINE HCL (CARDIAC) PF 100 MG/5ML IV SOSY
PREFILLED_SYRINGE | INTRAVENOUS | Status: DC | PRN
  Administered 2022-08-09: 50 mg via INTRAVENOUS

## 2022-08-09 MED ORDER — SODIUM CHLORIDE 0.9 % IV SOLN: INTRAVENOUS | Status: DC | PRN

## 2022-08-09 MED ORDER — PROPOFOL 1000 MG/100ML IV EMUL
INTRAVENOUS | Status: AC
  Filled 2022-08-09: qty 100

## 2022-08-09 MED ORDER — PROPOFOL 10 MG/ML IV BOLUS
INTRAVENOUS | Status: DC | PRN
  Administered 2022-08-09: 40 mg via INTRAVENOUS

## 2022-08-09 MED ORDER — SODIUM CHLORIDE 0.9 % IV SOLN
INTRAVENOUS | Status: DC
Start: 2022-08-09 — End: 2022-08-09

## 2022-08-09 MED ORDER — MIDAZOLAM HCL 2 MG/2ML IJ SOLN
INTRAMUSCULAR | Status: AC
  Filled 2022-08-09: qty 2

## 2022-08-09 MED ORDER — LIDOCAINE HCL (PF) 1 % IJ SOLN
INTRAMUSCULAR | Status: AC
  Administered 2022-08-09: 2 mL
  Filled 2022-08-09: qty 2

## 2022-08-09 NOTE — Transfer of Care (Addendum)
Immediate Anesthesia Transfer of Care Note  Patient: Tina Frey  Procedure(s) Performed: COLONOSCOPY WITH PROPOFOL  Patient Location: PACU and Endoscopy Unit  Anesthesia Type:MAC  Level of Consciousness: drowsy  Airway & Oxygen Therapy: Patient Spontanous Breathing  Post-op Assessment: Report given to RN and Post -op Vital signs reviewed and stable  Post vital signs: Reviewed and stable  Last Vitals:  Vitals Value Taken Time  BP    Temp    Pulse    Resp    SpO2      Last Pain:  Vitals:   08/09/22 0934  TempSrc: Temporal  PainSc: 6          Complications: No notable events documented.

## 2022-08-09 NOTE — Anesthesia Preprocedure Evaluation (Addendum)
Anesthesia Evaluation  Patient identified by MRN, date of birth, ID band Patient awake    Reviewed: Allergy & Precautions, NPO status , Patient's Chart, lab work & pertinent test results  History of Anesthesia Complications Negative for: history of anesthetic complications  Airway Mallampati: III   Neck ROM: Full    Dental   Pulmonary COPD, former smoker (quit 2005),    Pulmonary exam normal breath sounds clear to auscultation       Cardiovascular hypertension, + Peripheral Vascular Disease (carotid stenosis on Eliquis)  Normal cardiovascular exam+ Valvular Problems/Murmurs MVP  Rhythm:Regular Rate:Normal  Echo 03/17/22:  NORMAL LEFT VENTRICULAR SYSTOLIC FUNCTION  NORMAL RIGHT VENTRICULAR SYSTOLIC FUNCTION  MODERATE VALVULAR REGURGITATION  SCLEROTIC AORTIC VALVE WITHOUT STENOSIS  MODERATE LA ENLARGEMENT  MILD MITRAL VALVE PROLAPSE    Neuro/Psych PSYCHIATRIC DISORDERS Depression negative neurological ROS     GI/Hepatic negative GI ROS,   Endo/Other  negative endocrine ROS  Renal/GU negative Renal ROS     Musculoskeletal  (+) Arthritis ,   Abdominal   Peds  Hematology [facility]   Anesthesia Other Findings Cardiology note 03/28/22:  Plan  We have had a long discussion of peripheral vascular disease as well as carotid atherosclerosis and the future risks of morbidity and mortality. The patient will continue to reduce cardiovascular risk factors to the most significant degree possible and will continue anti-platelet therapy  -The patient understands all risks of future cardiovascular disease process and possible signs or symptoms of progression based on discussion today. We will continue all risk factor modification and prevention measures including lipid management, exercise and diet, blood pressure control, and anti-platelet medication management as tolerated. -Continue medical management for risk factor modification and  help with mitral insufficiency currently stable at this time. The patient will not need further intervention and or diagnostics at this time. The patient will continue to watch for onset of symptoms possibly related to above. -We have discussed risk reduction in the cardiovascular disease process by a continuation of lipid management with the current medication management for lipid reduction. The goals continue to be 30-50% lowering of LDL cholesterol in addition to lifestyle measures. This will include diet and improved activity level on a regular basis.The patient has an understanding of this discussion at this time and we will continue the appropriate strategy. -It has been stressed to the patient that the 4 most important factors of a healthy lifestyle with reduce cardiovascular implications includes abstinence of tobacco use, regular physical activity including the possibility of an exercise prescription, healthy diet, and maintaining an appropriate body mass index. The patient will consider these approaches in the future.  No orders of the defined types were placed in this encounter.  Return in about 1 year (around 03/29/2023).   Reproductive/Obstetrics                            Anesthesia Physical Anesthesia Plan  ASA: 3  Anesthesia Plan: General   Post-op Pain Management:    Induction: Intravenous  PONV Risk Score and Plan: 3 and Propofol infusion, TIVA and Treatment may vary due to age or medical condition  Airway Management Planned: Natural Airway  Additional Equipment:   Intra-op Plan:   Post-operative Plan:   Informed Consent: I have reviewed the patients History and Physical, chart, labs and discussed the procedure including the risks, benefits and alternatives for the proposed anesthesia with the patient or authorized representative who has indicated his/her understanding  and acceptance.       Plan Discussed with: CRNA  Anesthesia Plan  Comments: (LMA/GETA backup discussed.  Patient consented for risks of anesthesia including but not limited to:  - adverse reactions to medications - damage to eyes, teeth, lips or other oral mucosa - nerve damage due to positioning  - sore throat or hoarseness - damage to heart, brain, nerves, lungs, other parts of body or loss of life  Informed patient about role of CRNA in peri- and intra-operative care.  Patient voiced understanding.)        Anesthesia Quick Evaluation

## 2022-08-09 NOTE — Interval H&P Note (Signed)
History and Physical Interval Note:  08/09/2022 9:58 AM  Tina Frey  has presented today for surgery, with the diagnosis of H/O TA polyps.  The various methods of treatment have been discussed with the patient and family. After consideration of risks, benefits and other options for treatment, the patient has consented to  Procedure(s): COLONOSCOPY WITH PROPOFOL (N/A) as a surgical intervention.  The patient's history has been reviewed, patient examined, no change in status, stable for surgery.  I have reviewed the patient's chart and labs.  Questions were answered to the patient's satisfaction.     Lesly Rubenstein  Ok to proceed with colonoscopy

## 2022-08-09 NOTE — Op Note (Signed)
[facility] Patient Name: Tina Frey Procedure Date: 08/09/2022 9:42 AM MRN: [id number] Account #: [id number] Date of Birth: [birth year] Admit Type: Outpatient Age: 81 Room: [facility] ENDO ROOM 1 Gender: Female Note Status: Finalized Instrument Name: Jasper Riling [id number] Procedure:             Colonoscopy Indications:           Surveillance: Personal history of adenomatous polyps                         on last colonoscopy > 5 years ago Providers:             Andrey Farmer MD, MD Referring MD:          Ocie Cornfield. Ouida Sills MD, MD (Referring MD) Medicines:             Monitored Anesthesia Care Complications:         No immediate complications. Procedure:             Pre-Anesthesia Assessment:                        - Prior to the procedure, a History and Physical was                         performed, and patient medications and allergies were                         reviewed. The patient is competent. The risks and                         benefits of the procedure and the sedation options and                         risks were discussed with the patient. All questions                         were answered and informed consent was obtained.                         Patient identification and proposed procedure were                         verified by the physician, the nurse, the                         anesthesiologist, the anesthetist and the technician                         in the endoscopy suite. Mental Status Examination:                         alert and oriented. Airway Examination: normal                         oropharyngeal airway and neck mobility. Respiratory                         Examination: clear to auscultation. CV Examination:  normal. Prophylactic Antibiotics: The patient does not                         require prophylactic antibiotics. Prior                         Anticoagulants: The patient has taken no  previous                         anticoagulant or antiplatelet agents. ASA Grade                         Assessment: III - A patient with severe systemic                         disease. After reviewing the risks and benefits, the                         patient was deemed in satisfactory condition to                         undergo the procedure. The anesthesia plan was to use                         monitored anesthesia care (MAC). Immediately prior to                         administration of medications, the patient was                         re-assessed for adequacy to receive sedatives. The                         heart rate, respiratory rate, oxygen saturations,                         blood pressure, adequacy of pulmonary ventilation, and                         response to care were monitored throughout the                         procedure. The physical status of the patient was                         re-assessed after the procedure.                        After obtaining informed consent, the colonoscope was                         passed under direct vision. Throughout the procedure,                         the patient's blood pressure, pulse, and oxygen                         saturations were monitored continuously. The  Colonoscope was introduced through the anus and                         advanced to the the cecum, identified by appendiceal                         orifice and ileocecal valve. The colonoscopy was                         technically difficult and complex due to a redundant                         colon. The patient tolerated the procedure well. The                         quality of the bowel preparation was fair. Findings:      The perianal and digital rectal examinations were normal.      A few small-mouthed diverticula were found in the sigmoid colon.      Internal hemorrhoids were found during retroflexion. The hemorrhoids        were Grade I (internal hemorrhoids that do not prolapse).      The exam was otherwise without abnormality on direct and retroflexion       views. Impression:            - Preparation of the colon was fair.                        - Diverticulosis in the sigmoid colon.                        - Internal hemorrhoids.                        - The examination was otherwise normal on direct and                         retroflexion views.                        - No specimens collected. Recommendation:        - Discharge patient to home.                        - Resume previous diet.                        - Continue present medications.                        - Repeat colonoscopy is not recommended due to current                         age (20 years or older) for surveillance.                        - Return to referring physician as previously                         scheduled. Procedure Code(s):     --- Professional ---  G0105, Colorectal cancer screening; colonoscopy on                         individual at high risk Diagnosis Code(s):     --- Professional ---                        Z86.010, Personal history of colonic polyps                        K64.0, First degree hemorrhoids                        K57.30, Diverticulosis of large intestine without                         perforation or abscess without bleeding CPT copyright 2019 [employer]. All rights reserved. The codes documented in this report are preliminary and upon coder review may  be revised to meet current compliance requirements. Andrey Farmer MD, MD 08/09/2022 10:49:14 AM Number of Addenda: 0 Note Initiated On: 08/09/2022 9:42 AM Scope Withdrawal Time: 0 hours 7 minutes 20 seconds  Total Procedure Duration: 0 hours 22 minutes 37 seconds  Estimated Blood Loss:  Estimated blood loss: none.      [facility]

## 2022-08-09 NOTE — H&P (Signed)
Outpatient short stay form Pre-procedure 08/09/2022  Lesly Rubenstein, MD  Primary Physician: Kirk Ruths, MD  Reason for visit:  Surveillance  History of present illness:    81 y/o lady with history of hypertension, HLD, and depression here for colonoscopy for history of polyps. Last colonoscopy in 2017 with two small Ta's. History of hysterectomy and appendectomy. No blood thinners. No family history of GI malignancies.    Current Facility-Administered Medications:    0.9 %  sodium chloride infusion, , Intravenous, Continuous, Deaysia Grigoryan, Hilton Cork, MD  Medications Prior to Admission  Medication Sig Dispense Refill Last Dose   atorvastatin (LIPITOR) 20 MG tablet Take 20 mg by mouth every morning.    08/08/2022   lisinopril (PRINIVIL,ZESTRIL) 10 MG tablet Take 10 mg by mouth every morning.    08/08/2022   metoprolol tartrate (LOPRESSOR) 50 MG tablet Take 25 mg by mouth 2 (two) times daily.   08/09/2022   sertraline (ZOLOFT) 50 MG tablet Take 50 mg by mouth every morning.    08/08/2022   acetaminophen (TYLENOL) 500 MG tablet Take 1,000 mg by mouth every 6 (six) hours as needed for mild pain or headache.      apixaban (ELIQUIS) 2.5 MG TABS tablet Take 1 tablet (2.5 mg total) by mouth 2 (two) times daily. (Patient not taking: Reported on 08/09/2022) 30 tablet 0 Not Taking   Biotin 5000 MCG CAPS Take 1 capsule (5,000 mcg total) by mouth daily. 30 capsule     Carboxymethylcellul-Glycerin (REFRESH RELIEVA PF OP) Place 1 drop into both eyes in the morning, at noon, and at bedtime.      Cholecalciferol (VITAMIN D3) 10 MCG (400 UNIT) CAPS Take 400 Units by mouth daily.      EPINEPHrine (PRIMATENE MIST) 0.125 MG/ACT AERO Inhale 1 puff into the lungs every 6 (six) hours as needed (shortness of breath).      HYDROcodone-acetaminophen (NORCO) 5-325 MG tablet Take 1-2 tablets by mouth every 6 (six) hours as needed for moderate pain or severe pain. MAXIMUM TOTAL ACETAMINOPHEN DOSE IS 4000 MG PER  DAY 40 tablet 0    Multiple Vitamins-Minerals (PRESERVISION AREDS PO) Take 1 capsule by mouth 2 (two) times daily.       traZODone (DESYREL) 50 MG tablet Take 50 mg by mouth at bedtime.         Allergies  Allergen Reactions   Ciprofloxacin Nausea And Vomiting   Morphine And Related Nausea And Vomiting   Cephalexin Nausea Only   Sulfa Antibiotics Other (See Comments)    Causes yeast infections      Past Medical History:  Diagnosis Date   Allergy    Arthritis    Arthritis    Cancer ([place])    basal cell on forehead   Carotid artery stenosis    Cataract    COPD (chronic obstructive pulmonary disease) ([place])    patient denies this diagnosis.   Heart murmur    Hyperlipidemia    Hypertension    Mitral valve prolapse    Scoliosis    Wears dentures     Review of systems:  Otherwise negative.    Physical Exam  Gen: Alert, oriented. Appears stated age.  HEENT: PERRLA. Lungs: No respiratory distress CV: RRR Abd: soft, benign, no masses Ext: No edema    Planned procedures: Proceed with colonoscopy. The patient understands the nature of the planned procedure, indications, risks, alternatives and potential complications including but not limited to bleeding, infection, perforation, damage to internal organs  and possible oversedation/side effects from anesthesia. The patient agrees and gives consent to proceed.  Please refer to procedure notes for findings, recommendations and patient disposition/instructions.     Lesly Rubenstein, MD [facility]

## 2022-08-09 NOTE — Anesthesia Postprocedure Evaluation (Signed)
Anesthesia Post Note  Patient: Tina Frey  Procedure(s) Performed: COLONOSCOPY WITH PROPOFOL  Patient location during evaluation: PACU Anesthesia Type: General Level of consciousness: awake and alert, oriented and patient cooperative Pain management: pain level controlled Vital Signs Assessment: post-procedure vital signs reviewed and stable Respiratory status: spontaneous breathing, nonlabored ventilation and respiratory function stable Cardiovascular status: blood pressure returned to baseline and stable Postop Assessment: adequate PO intake Anesthetic complications: no   No notable events documented.   Last Vitals:  Vitals:   08/09/22 1058 08/09/22 1108  BP: 118/68 (!) 141/69  Pulse: 71 66  Resp: 16 18  Temp:    SpO2: 98% 97%    Last Pain:  Vitals:   08/09/22 1108  TempSrc:   PainSc: 0-No pain                 Darrin Nipper

## 2022-08-10 ENCOUNTER — Encounter: Payer: Self-pay | Admitting: Gastroenterology

## 2022-08-10 ENCOUNTER — Other Ambulatory Visit: Payer: Self-pay | Admitting: Internal Medicine

## 2022-08-10 DIAGNOSIS — Z1231 Encounter for screening mammogram for malignant neoplasm of breast: Secondary | ICD-10-CM

## 2022-09-01 ENCOUNTER — Ambulatory Visit
Admission: RE | Admit: 2022-09-01 | Discharge: 2022-09-01 | Disposition: A | Discharge status: Home / Self Care | Payer: Medicare Other | Source: Ambulatory Visit | Attending: Internal Medicine | Admitting: Internal Medicine

## 2022-09-01 DIAGNOSIS — Z1231 Encounter for screening mammogram for malignant neoplasm of breast: Secondary | ICD-10-CM | POA: Diagnosis present

## 2023-10-26 ENCOUNTER — Other Ambulatory Visit: Payer: Self-pay | Admitting: Internal Medicine

## 2023-10-26 DIAGNOSIS — Z1231 Encounter for screening mammogram for malignant neoplasm of breast: Secondary | ICD-10-CM

## 2023-11-08 ENCOUNTER — Ambulatory Visit
Admission: RE | Admit: 2023-11-08 | Discharge: 2023-11-08 | Disposition: A | Discharge status: Home / Self Care | Payer: Medicare Other | Source: Ambulatory Visit | Attending: Internal Medicine | Admitting: Internal Medicine

## 2023-11-08 DIAGNOSIS — Z1231 Encounter for screening mammogram for malignant neoplasm of breast: Secondary | ICD-10-CM | POA: Diagnosis present

## 2024-03-29 ENCOUNTER — Encounter: Payer: Self-pay | Admitting: Student

## 2024-04-01 ENCOUNTER — Other Ambulatory Visit: Payer: Self-pay | Admitting: Student

## 2024-04-01 DIAGNOSIS — R2689 Other abnormalities of gait and mobility: Secondary | ICD-10-CM

## 2024-04-01 DIAGNOSIS — R413 Other amnesia: Secondary | ICD-10-CM

## 2024-04-13 ENCOUNTER — Ambulatory Visit
Admission: RE | Admit: 2024-04-13 | Discharge: 2024-04-13 | Disposition: A | Discharge status: Home / Self Care | Source: Ambulatory Visit | Attending: Student

## 2024-04-13 DIAGNOSIS — R2689 Other abnormalities of gait and mobility: Secondary | ICD-10-CM

## 2024-04-13 DIAGNOSIS — R413 Other amnesia: Secondary | ICD-10-CM
